# Patient Record
Sex: Male | Born: 1965 | Hispanic: No | Marital: Single | Smoking: Current every day smoker
Health system: Southern US, Community
[De-identification: ages and names within clinical notes are randomized; demographics above are authoritative.]

## PROBLEM LIST (undated history)

## (undated) DIAGNOSIS — M549 Dorsalgia, unspecified: Secondary | ICD-10-CM

## (undated) DIAGNOSIS — F329 Major depressive disorder, single episode, unspecified: Secondary | ICD-10-CM

## (undated) DIAGNOSIS — M62830 Muscle spasm of back: Secondary | ICD-10-CM

## (undated) DIAGNOSIS — F32A Depression, unspecified: Secondary | ICD-10-CM

## (undated) DIAGNOSIS — I10 Essential (primary) hypertension: Secondary | ICD-10-CM

## (undated) DIAGNOSIS — R531 Weakness: Secondary | ICD-10-CM

## (undated) DIAGNOSIS — G8929 Other chronic pain: Secondary | ICD-10-CM

## (undated) HISTORY — PX: OTHER SURGICAL HISTORY: SHX169

## (undated) HISTORY — PX: BACK SURGERY: SHX140

---

## 2005-02-15 ENCOUNTER — Emergency Department (HOSPITAL_COMMUNITY): Admission: EM | Admit: 2005-02-15 | Discharge: 2005-02-15 | Payer: Self-pay | Admitting: Emergency Medicine

## 2005-08-12 ENCOUNTER — Emergency Department (HOSPITAL_COMMUNITY): Admission: EM | Admit: 2005-08-12 | Discharge: 2005-08-12 | Payer: Self-pay | Admitting: Emergency Medicine

## 2005-09-28 ENCOUNTER — Emergency Department (HOSPITAL_COMMUNITY): Admission: EM | Admit: 2005-09-28 | Discharge: 2005-09-28 | Payer: Self-pay | Admitting: Emergency Medicine

## 2005-09-29 ENCOUNTER — Emergency Department (HOSPITAL_COMMUNITY): Admission: EM | Admit: 2005-09-29 | Discharge: 2005-09-29 | Payer: Self-pay | Admitting: Emergency Medicine

## 2006-01-21 ENCOUNTER — Emergency Department (HOSPITAL_COMMUNITY): Admission: EM | Admit: 2006-01-21 | Discharge: 2006-01-22 | Payer: Self-pay | Admitting: Emergency Medicine

## 2006-02-04 ENCOUNTER — Emergency Department (HOSPITAL_COMMUNITY): Admission: EM | Admit: 2006-02-04 | Discharge: 2006-02-04 | Payer: Self-pay | Admitting: Emergency Medicine

## 2006-02-25 ENCOUNTER — Emergency Department (HOSPITAL_COMMUNITY): Admission: EM | Admit: 2006-02-25 | Discharge: 2006-02-25 | Payer: Self-pay | Admitting: Emergency Medicine

## 2006-03-07 ENCOUNTER — Emergency Department (HOSPITAL_COMMUNITY): Admission: EM | Admit: 2006-03-07 | Discharge: 2006-03-07 | Payer: Self-pay | Admitting: Emergency Medicine

## 2006-03-11 ENCOUNTER — Emergency Department (HOSPITAL_COMMUNITY): Admission: EM | Admit: 2006-03-11 | Discharge: 2006-03-11 | Payer: Self-pay | Admitting: Emergency Medicine

## 2006-03-19 ENCOUNTER — Emergency Department (HOSPITAL_COMMUNITY): Admission: EM | Admit: 2006-03-19 | Discharge: 2006-03-19 | Payer: Self-pay | Admitting: Emergency Medicine

## 2006-03-29 ENCOUNTER — Emergency Department (HOSPITAL_COMMUNITY): Admission: EM | Admit: 2006-03-29 | Discharge: 2006-03-29 | Payer: Self-pay | Admitting: Emergency Medicine

## 2006-07-04 ENCOUNTER — Encounter: Admission: RE | Admit: 2006-07-04 | Discharge: 2006-07-04 | Payer: Self-pay | Admitting: Occupational Medicine

## 2010-06-15 ENCOUNTER — Observation Stay (HOSPITAL_COMMUNITY): Admission: EM | Admit: 2010-06-15 | Discharge: 2010-06-16 | Payer: Self-pay | Admitting: Emergency Medicine

## 2010-10-10 LAB — CULTURE, BLOOD (ROUTINE X 2)
Culture  Setup Time: 201111180000
Culture: NO GROWTH
Culture: NO GROWTH

## 2010-10-10 LAB — URINALYSIS, ROUTINE W REFLEX MICROSCOPIC
Glucose, UA: NEGATIVE mg/dL
Hgb urine dipstick: NEGATIVE
Ketones, ur: 15 mg/dL — AB
Protein, ur: NEGATIVE mg/dL

## 2010-10-10 LAB — POCT I-STAT, CHEM 8
Calcium, Ion: 1.07 mmol/L — ABNORMAL LOW (ref 1.12–1.32)
Chloride: 107 mEq/L (ref 96–112)
Glucose, Bld: 106 mg/dL — ABNORMAL HIGH (ref 70–99)
HCT: 49 % (ref 39.0–52.0)
TCO2: 23 mmol/L (ref 0–100)

## 2010-10-10 LAB — DIFFERENTIAL
Basophils Relative: 0 % (ref 0–1)
Eosinophils Relative: 0 % (ref 0–5)
Monocytes Absolute: 1.1 10*3/uL — ABNORMAL HIGH (ref 0.1–1.0)
Monocytes Relative: 5 % (ref 3–12)
Neutro Abs: 17.9 10*3/uL — ABNORMAL HIGH (ref 1.7–7.7)

## 2010-10-10 LAB — PROTIME-INR: INR: 1.14 (ref 0.00–1.49)

## 2010-10-10 LAB — ETHANOL: Alcohol, Ethyl (B): <5 mg/dL (ref 0–10)

## 2010-10-10 LAB — CARDIAC PANEL(CRET KIN+CKTOT+MB+TROPI)
CK, MB: 2.3 ng/mL (ref 0.3–4.0)
CK, MB: 2.6 ng/mL (ref 0.3–4.0)

## 2010-10-10 LAB — URINE CULTURE
Colony Count: NO GROWTH
Culture: NO GROWTH

## 2010-10-10 LAB — CBC
HCT: 45 % (ref 39.0–52.0)
Hemoglobin: 15.1 g/dL (ref 13.0–17.0)
Hemoglobin: 16.4 g/dL (ref 13.0–17.0)
MCH: 30.4 pg (ref 26.0–34.0)
MCH: 31 pg (ref 26.0–34.0)
MCHC: 36.4 g/dL — ABNORMAL HIGH (ref 30.0–36.0)
MCV: 85.1 fL (ref 78.0–100.0)
RBC: 4.96 MIL/uL (ref 4.22–5.81)
WBC: 8.1 10*3/uL (ref 4.0–10.5)

## 2010-10-10 LAB — POCT CARDIAC MARKERS: Troponin i, poc: <0.05 ng/mL (ref 0.00–0.09)

## 2010-10-10 LAB — COMPREHENSIVE METABOLIC PANEL
ALT: 15 U/L (ref 0–53)
AST: 26 U/L (ref 0–37)
CO2: 23 mEq/L (ref 19–32)
Calcium: 8.8 mg/dL (ref 8.4–10.5)
Chloride: 110 mEq/L (ref 96–112)
GFR calc Af Amer: >60 mL/min (ref >60)
GFR calc non Af Amer: >60 mL/min (ref >60)
Potassium: 3.7 mEq/L (ref 3.5–5.1)
Sodium: 139 mEq/L (ref 135–145)
Total Bilirubin: 1.1 mg/dL (ref 0.3–1.2)

## 2010-10-10 LAB — RAPID STREP SCREEN (MED CTR MEBANE ONLY): Streptococcus, Group A Screen (Direct): NEGATIVE

## 2010-10-10 LAB — RAPID URINE DRUG SCREEN, HOSP PERFORMED
Barbiturates: NOT DETECTED
Benzodiazepines: NOT DETECTED
Cocaine: POSITIVE — AB

## 2010-10-10 LAB — LEUKOCYTE ALKALINE PHOSPHATASE: Leukocyte Alkaline  Phos Stain: 150 — ABNORMAL HIGH (ref 30–140)

## 2010-11-10 ENCOUNTER — Emergency Department (HOSPITAL_COMMUNITY): Payer: Self-pay

## 2010-11-10 ENCOUNTER — Emergency Department (HOSPITAL_COMMUNITY)
Admission: EM | Admit: 2010-11-10 | Discharge: 2010-11-10 | Disposition: A | Discharge status: Home / Self Care | Payer: Self-pay | Attending: Emergency Medicine | Admitting: Emergency Medicine

## 2010-11-10 DIAGNOSIS — I491 Atrial premature depolarization: Secondary | ICD-10-CM | POA: Insufficient documentation

## 2010-11-10 DIAGNOSIS — R0789 Other chest pain: Secondary | ICD-10-CM | POA: Insufficient documentation

## 2010-11-10 LAB — COMPREHENSIVE METABOLIC PANEL
AST: 26 U/L (ref 0–37)
Albumin: 4 g/dL (ref 3.5–5.2)
Alkaline Phosphatase: 78 U/L (ref 39–117)
Chloride: 105 mEq/L (ref 96–112)
GFR calc Af Amer: >60 mL/min (ref >60)
Potassium: 3.9 mEq/L (ref 3.5–5.1)
Sodium: 136 mEq/L (ref 135–145)
Total Bilirubin: 1 mg/dL (ref 0.3–1.2)
Total Protein: 7.2 g/dL (ref 6.0–8.3)

## 2010-11-10 LAB — POCT CARDIAC MARKERS: Troponin i, poc: <0.05 ng/mL (ref 0.00–0.09)

## 2010-11-10 LAB — CBC
HCT: 43.2 % (ref 39.0–52.0)
Hemoglobin: 16 g/dL (ref 13.0–17.0)
MCH: 31.3 pg (ref 26.0–34.0)
MCV: 84.4 fL (ref 78.0–100.0)
RBC: 5.12 MIL/uL (ref 4.22–5.81)

## 2011-08-14 ENCOUNTER — Emergency Department (INDEPENDENT_AMBULATORY_CARE_PROVIDER_SITE_OTHER)
Admission: EM | Admit: 2011-08-14 | Discharge: 2011-08-14 | Disposition: A | Discharge status: Home / Self Care | Payer: BC Managed Care – PPO | Source: Home / Self Care | Attending: Emergency Medicine | Admitting: Emergency Medicine

## 2011-08-14 ENCOUNTER — Encounter (HOSPITAL_COMMUNITY): Payer: Self-pay

## 2011-08-14 ENCOUNTER — Emergency Department (INDEPENDENT_AMBULATORY_CARE_PROVIDER_SITE_OTHER): Payer: BC Managed Care – PPO

## 2011-08-14 DIAGNOSIS — S20229A Contusion of unspecified back wall of thorax, initial encounter: Secondary | ICD-10-CM

## 2011-08-14 DIAGNOSIS — S300XXA Contusion of lower back and pelvis, initial encounter: Secondary | ICD-10-CM

## 2011-08-14 MED ORDER — TRAMADOL HCL 50 MG PO TABS: 100.0000 mg | ORAL_TABLET | Freq: Three times a day (TID) | ORAL | Status: AC | PRN

## 2011-08-14 MED ORDER — IBUPROFEN 800 MG PO TABS
ORAL_TABLET | ORAL | Status: AC
  Filled 2011-08-14: qty 1

## 2011-08-14 MED ORDER — METHOCARBAMOL 500 MG PO TABS: 500.0000 mg | ORAL_TABLET | Freq: Three times a day (TID) | ORAL | Status: AC

## 2011-08-14 MED ORDER — IBUPROFEN 800 MG PO TABS: 800.0000 mg | ORAL_TABLET | Freq: Once | ORAL | Status: DC

## 2011-08-14 NOTE — ED Provider Notes (Signed)
Chief Complaint  Patient presents with  . Back Pain    History of Present Illness:  5 days ago, the patient slipped going down some steps and fell down 4 steps, landing on his buttocks and back. Ever since then he's had pain in his lower back area and some tingling in the lower back. There's been no radiation. No numbness, tingling, weakness, bladder, or bowel symptoms. He denies any abdominal pain. He did not hit his head and did not lose consciousness. There's been no blood in his urine. It hurts to walk stand and bend or lift.  Review of Systems:  Other than noted above, the patient denies any of the following symptoms: Systemic:  No fever, chills, fatigue, or weight loss. GI:  No abdominal pain, nausea, vomiting, diarrhea, constipation or blood in stool. GU:  No dysuria, frequency, urgency, or hematuria. No incontinence or difficulty urinating.  M-S:  No neck pain, joint pain, arthritis, or myalgias. Neuro:  No parethesias or muscular weakness. Skin:  No rash or itching.   PMFSH:  Past medical history, family history, social history, meds, and allergies were reviewed.  Physical Exam:   Vital signs:  BP 127/86  Pulse 84  Temp(Src) 98.4 F (36.9 C) (Oral)  Resp 18  SpO2 100% General:  Alert, oriented, in no distress. Abdomen:  Soft, non-tender.  No organomegaly or mass.  No pulsatile midline abdominal mass or bruit. Back:  Exam of the back reveals tenderness to palpation over the entire lumbar spine. There is no swelling, bruising, or deformity. The back has limited range of motion, about 30% of normal with pain. Straight leg raising was negative. Neuro:  Normal muscle strength, sensations and DTRs. Skin:  Clear, warm and dry.  No rash.   Radiology:  Dg Lumbar Spine Complete  08/14/2011  *RADIOLOGY REPORT*  Clinical Data: Low back pain after falling down steps several days ago.  LUMBAR SPINE - COMPLETE 4+ VIEW  Comparison: Radiographs dated 02/15/2005  Findings: There is no acute  fracture.  There is slight retrolisthesis of L3 on L4.  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  IMPRESSION: No acute abnormalities.  Chronic changes at L5-S1 as described.  Original Report Authenticated By: Gwynn Burly, M.D.    Medications given in [facility]:  None  Assessment:   Diagnoses that have been ruled out:  None  Diagnoses that are still under consideration:  None  Final diagnoses:  Contusion of lower back    Plan:   1.  The following meds were prescribed:   New Prescriptions   METHOCARBAMOL (ROBAXIN) 500 MG TABLET    Take 1 tablet (500 mg total) by mouth 3 (three) times daily.   TRAMADOL (ULTRAM) 50 MG TABLET    Take 2 tablets (100 mg total) by mouth every 8 (eight) hours as needed for pain.   2.  The patient was instructed in symptomatic care and handouts were given. 3.  The patient was told to return if becoming worse in any way, if no better in 3 or 4 days, and given some red flag symptoms that would indicate earlier return.    Roque Lias, MD 08/14/11 [phone]

## 2011-08-14 NOTE — ED Notes (Signed)
Pt states he was moving a love seat on Friday, fell down approx 4 steps.  C/o low back pain that has progressively gotten worse.  Has applied hot compresses without relief

## 2011-11-20 ENCOUNTER — Emergency Department (INDEPENDENT_AMBULATORY_CARE_PROVIDER_SITE_OTHER)
Admission: EM | Admit: 2011-11-20 | Discharge: 2011-11-20 | Disposition: A | Discharge status: Home / Self Care | Payer: BC Managed Care – PPO | Source: Home / Self Care | Attending: Emergency Medicine | Admitting: Emergency Medicine

## 2011-11-20 ENCOUNTER — Encounter (HOSPITAL_COMMUNITY): Payer: Self-pay | Admitting: *Deleted

## 2011-11-20 DIAGNOSIS — S39012A Strain of muscle, fascia and tendon of lower back, initial encounter: Secondary | ICD-10-CM

## 2011-11-20 DIAGNOSIS — S335XXA Sprain of ligaments of lumbar spine, initial encounter: Secondary | ICD-10-CM

## 2011-11-20 MED ORDER — METHOCARBAMOL 500 MG PO TABS: 500.0000 mg | ORAL_TABLET | Freq: Three times a day (TID) | ORAL | Status: AC

## 2011-11-20 MED ORDER — PREDNISONE 5 MG PO KIT: 1.0000 | PACK | Freq: Every day | ORAL | Status: DC

## 2011-11-20 MED ORDER — TRAMADOL HCL 50 MG PO TABS
100.0000 mg | ORAL_TABLET | Freq: Three times a day (TID) | ORAL | Status: AC | PRN
Start: 2011-11-20 — End: 2011-11-30

## 2011-11-20 NOTE — ED Notes (Signed)
States holding bucket of sand and "turned the wrong way" and low back "twinged" and pt states he "buckled over" but states he did not his the floor. States his low back hurt real bad.

## 2011-11-20 NOTE — ED Provider Notes (Signed)
Chief Complaint  Patient presents with  . Back Pain    History of Present Illness:   The patient is a 46 year old male who injured his low back at work today. He was passing a bucket of sand to someone else, he twisted and felt a twinge in his lower lumbar spine. His back buckled on him but he did not hit the ground. The pain is now localized to the midline, lower back without radiation to the legs. There is no numbness, tingling, weakness in lower extremities. No bladder or bowel symptoms. He does have a history of arthritis in his back.  Review of Systems:  Other than noted above, the patient denies any of the following symptoms: Systemic:  No fever, chills, fatigue, or weight loss. GI:  No abdominal pain, nausea, vomiting, diarrhea, constipation or blood in stool. GU:  No dysuria, frequency, urgency, or hematuria. No incontinence or difficulty urinating.  M-S:  No neck pain, joint pain, arthritis, or myalgias. Neuro:  No parethesias or muscular weakness. Skin:  No rash or itching.   PMFSH:  Past medical history, family history, social history, meds, and allergies were reviewed.  Physical Exam:   Vital signs:  BP 154/91  Pulse 84  Temp(Src) 98.8 F (37.1 C) (Oral)  Resp 16  SpO2 100% General:  Alert, oriented, in no distress. Abdomen:  Soft, non-tender.  No organomegaly or mass.  No pulsatile midline abdominal mass or bruit. Back:  There is pain to palpation in the midline lower lumbar spine. No pain to palpation in the paravertebral muscles, but he does have some muscle spasm. There is no pain over the sacroiliac area. The back has a limited range of motion with 30 of flexion, 20 lateral bending, 10 extension, and 45 of rotation with pain. Straight leg raising was negative. Neuro:  Normal muscle strength, sensations and DTRs. Skin:  Clear, warm and dry.  No rash.  Assessment:  The encounter diagnosis was Lumbar strain.  Plan:   1.  The following meds were prescribed:   New  Prescriptions   METHOCARBAMOL (ROBAXIN) 500 MG TABLET    Take 1 tablet (500 mg total) by mouth 3 (three) times daily.   PREDNISONE 5 MG KIT    Take 1 kit (5 mg total) by mouth daily after breakfast. Prednisone 5 mg 6 day dosepack.  Take as directed.   TRAMADOL (ULTRAM) 50 MG TABLET    Take 2 tablets (100 mg total) by mouth every 8 (eight) hours as needed for pain.   2.  The patient was instructed in symptomatic care and handouts were given. 3.  The patient was told to return if becoming worse in any way, if no better in 3 or 4 days, and given some red flag symptoms that would indicate earlier return. He was told to followup in 2 days at: Occupational health. In the meantime he is given back exercises to start. He will be allowed to go back to work tomorrow but with light duty. No lifting over 10 pounds.    Reuben Likes, MD 11/20/11 2003

## 2011-11-20 NOTE — Discharge Instructions (Signed)
Back Exercises Back exercises help treat and prevent back injuries. The goal of back exercises is to increase the strength of your abdominal and back muscles and the flexibility of your back. These exercises should be started when you no longer have back pain. Back exercises include:  Pelvic Tilt. Lie on your back with your knees bent. Tilt your pelvis until the lower part of your back is against the floor. Hold this position 5 to 10 sec and repeat 5 to 10 times.   Knee to Chest. Pull first 1 knee up against your chest and hold for 20 to 30 seconds, repeat this with the other knee, and then both knees. This may be done with the other leg straight or bent, whichever feels better.   Sit-Ups or Curl-Ups. Bend your knees 90 degrees. Start with tilting your pelvis, and do a partial, slow sit-up, lifting your trunk only 30 to 45 degrees off the floor. Take at least 2 to 3 seconds for each sit-up. Do not do sit-ups with your knees out straight. If partial sit-ups are difficult, simply do the above but with only tightening your abdominal muscles and holding it as directed.   Hip-Lift. Lie on your back with your knees flexed 90 degrees. Push down with your feet and shoulders as you raise your hips a couple inches off the floor; hold for 10 seconds, repeat 5 to 10 times.   Back arches. Lie on your stomach, propping yourself up on bent elbows. Slowly press on your hands, causing an arch in your low back. Repeat 3 to 5 times. Any initial stiffness and discomfort should lessen with repetition over time.   Shoulder-Lifts. Lie face down with arms beside your body. Keep hips and torso pressed to floor as you slowly lift your head and shoulders off the floor.  Do not overdo your exercises, especially in the beginning. Exercises may cause you some mild back discomfort which lasts for a few minutes; however, if the pain is more severe, or lasts for more than 15 minutes, do not continue exercises until you see your  caregiver. Improvement with exercise therapy for back problems is slow.  See your caregivers for assistance with developing a proper back exercise program. Document Released: 08/23/2004 Document Revised: 07/05/2011 Document Reviewed: 07/16/2005 [employer] Patient Information 2012 [employer]. 

## 2012-01-27 ENCOUNTER — Encounter (HOSPITAL_COMMUNITY): Payer: Self-pay | Admitting: Emergency Medicine

## 2012-01-27 ENCOUNTER — Emergency Department (HOSPITAL_COMMUNITY)
Admission: EM | Admit: 2012-01-27 | Discharge: 2012-01-27 | Disposition: A | Discharge status: Home / Self Care | Payer: Self-pay | Attending: Emergency Medicine | Admitting: Emergency Medicine

## 2012-01-27 ENCOUNTER — Emergency Department (HOSPITAL_COMMUNITY): Payer: Self-pay

## 2012-01-27 DIAGNOSIS — R109 Unspecified abdominal pain: Secondary | ICD-10-CM

## 2012-01-27 DIAGNOSIS — R1012 Left upper quadrant pain: Secondary | ICD-10-CM | POA: Insufficient documentation

## 2012-01-27 DIAGNOSIS — F1410 Cocaine abuse, uncomplicated: Secondary | ICD-10-CM | POA: Insufficient documentation

## 2012-01-27 DIAGNOSIS — F17200 Nicotine dependence, unspecified, uncomplicated: Secondary | ICD-10-CM | POA: Insufficient documentation

## 2012-01-27 DIAGNOSIS — R000 Tachycardia, unspecified: Secondary | ICD-10-CM | POA: Insufficient documentation

## 2012-01-27 LAB — POCT I-STAT TROPONIN I

## 2012-01-27 LAB — RAPID URINE DRUG SCREEN, HOSP PERFORMED
Benzodiazepines: NOT DETECTED
Cocaine: POSITIVE — AB

## 2012-01-27 LAB — CBC WITH DIFFERENTIAL/PLATELET
Eosinophils Absolute: 0 10*3/uL (ref 0.0–0.7)
Hemoglobin: 15.8 g/dL (ref 13.0–17.0)
Lymphocytes Relative: 15 % (ref 12–46)
Lymphs Abs: 0.9 10*3/uL (ref 0.7–4.0)
MCH: 30 pg (ref 26.0–34.0)
MCV: 84.4 fL (ref 78.0–100.0)
Monocytes Relative: 7 % (ref 3–12)
Neutrophils Relative %: 77 % (ref 43–77)
RBC: 5.26 MIL/uL (ref 4.22–5.81)
WBC: 5.7 10*3/uL (ref 4.0–10.5)

## 2012-01-27 LAB — COMPREHENSIVE METABOLIC PANEL
ALT: 17 U/L (ref 0–53)
Alkaline Phosphatase: 93 U/L (ref 39–117)
BUN: 12 mg/dL (ref 6–23)
CO2: 22 mEq/L (ref 19–32)
GFR calc Af Amer: 84 mL/min — ABNORMAL LOW (ref >90)
GFR calc non Af Amer: 73 mL/min — ABNORMAL LOW (ref >90)
Glucose, Bld: 112 mg/dL — ABNORMAL HIGH (ref 70–99)
Potassium: 3.8 mEq/L (ref 3.5–5.1)
Sodium: 139 mEq/L (ref 135–145)
Total Bilirubin: 0.5 mg/dL (ref 0.3–1.2)
Total Protein: 7.8 g/dL (ref 6.0–8.3)

## 2012-01-27 LAB — LIPASE, BLOOD: Lipase: 13 U/L (ref 11–59)

## 2012-01-27 MED ORDER — IOHEXOL 300 MG/ML  SOLN
100.0000 mL | Freq: Once | INTRAMUSCULAR | Status: AC | PRN
  Administered 2012-01-27: 100 mL via INTRAVENOUS

## 2012-01-27 MED ORDER — LORAZEPAM 2 MG/ML IJ SOLN
1.0000 mg | Freq: Once | INTRAMUSCULAR | Status: AC
  Administered 2012-01-27: 1 mg via INTRAVENOUS
  Filled 2012-01-27: qty 1

## 2012-01-27 MED ORDER — ONDANSETRON HCL 4 MG/2ML IJ SOLN
4.0000 mg | Freq: Once | INTRAMUSCULAR | Status: AC
  Administered 2012-01-27: 4 mg via INTRAVENOUS
  Filled 2012-01-27: qty 2

## 2012-01-27 MED ORDER — DIPHENHYDRAMINE HCL 50 MG/ML IJ SOLN
25.0000 mg | Freq: Once | INTRAMUSCULAR | Status: AC
  Administered 2012-01-27: 08:00:00 via INTRAVENOUS
  Filled 2012-01-27: qty 1

## 2012-01-27 MED ORDER — HYDROMORPHONE HCL PF 1 MG/ML IJ SOLN
1.0000 mg | Freq: Once | INTRAMUSCULAR | Status: AC
  Administered 2012-01-27: 1 mg via INTRAVENOUS
  Filled 2012-01-27: qty 1

## 2012-01-27 MED ORDER — IOHEXOL 300 MG/ML  SOLN: 20.0000 mL | INTRAMUSCULAR | Status: AC

## 2012-01-27 MED ORDER — LORAZEPAM 2 MG/ML IJ SOLN
INTRAMUSCULAR | Status: AC
  Administered 2012-01-27: 2 mg
  Filled 2012-01-27: qty 1

## 2012-01-27 MED ORDER — SODIUM CHLORIDE 0.9 % IV BOLUS (SEPSIS)
500.0000 mL | Freq: Once | INTRAVENOUS | Status: AC
  Administered 2012-01-27: 08:00:00 via INTRAVENOUS

## 2012-01-27 NOTE — ED Notes (Signed)
Hunt, PA-C and Rancour, MD aware of ? Seizure activity - pt. Stiff, and unable to turn head to rt. Alert, no drooling, or incontinence; is following commands.

## 2012-01-27 NOTE — ED Provider Notes (Signed)
History     CSN: [id number]  Arrival date & time 01/27/12  0707   First MD Initiated Contact with Patient 01/27/12 [phone]      Chief Complaint  Patient presents with  . Abdominal Pain    (Consider location/radiation/quality/duration/timing/severity/associated sxs/prior treatment) HPI  Patient presents to ER by EMS complaining of acute onset LUQ abdominal pain (not CP as reported initially by EMS) stating that around 4am this morning he used cocaine for the first time "in a long time" and shortly after had acute onset LUQ pain. Patient states pain is severe and constant since onset denying aggravating or alleviating factors. Patient took nothing for pain PTA. Patient states he smoke tobacco and has occasional alcohol use and states that he had not used cocaine for a long time until this morning. Denies any known medical problems and takes no meds on regular basis. Denies hx of similar pain.   History reviewed. No pertinent past medical history.  History reviewed. No pertinent past surgical history.  No family history on file.  History  Substance Use Topics  . Smoking status: Current Everyday Smoker  . Smokeless tobacco: Not on file  . Alcohol Use: No      Review of Systems  All other systems reviewed and are negative.    Allergies  Penicillins and Ibuprofen  Home Medications   Current Outpatient Rx  Name Route Sig Dispense Refill  . PREDNISONE 5 MG PO KIT Oral Take 1 kit (5 mg total) by mouth daily after breakfast. Prednisone 5 mg 6 day dosepack.  Take as directed. 1 kit 0    BP 158/101  Pulse 112  Temp 98.2 F (36.8 C) (Oral)  Resp 13  SpO2 98%  Physical Exam  Nursing note and vitals reviewed. Constitutional: He is oriented to person, place, and time. He appears well-developed and well-nourished. No distress.  HENT:  Head: Normocephalic and atraumatic.  Eyes: Conjunctivae are normal.  Neck: Normal range of motion. Neck supple.  Cardiovascular: Regular  rhythm, normal heart sounds and intact distal pulses.  Tachycardia present.  Exam reveals no gallop and no friction rub.   No murmur heard. Pulmonary/Chest: Effort normal and breath sounds normal. No respiratory distress. He has no wheezes. He has no rales. He exhibits no tenderness.  Abdominal: Soft. Bowel sounds are normal. He exhibits no distension and no mass. There is tenderness. There is no rebound and no guarding.       Moderate TTP of LUQ with guarding but no rigidity or peritoneal signs. Remainder of abdomen non tender.   Musculoskeletal: Normal range of motion. He exhibits no edema and no tenderness.  Neurological: He is alert and oriented to person, place, and time.  Skin: Skin is warm and dry. No rash noted. He is not diaphoretic. No erythema.  Psychiatric: He has a normal mood and affect.    ED Course  Procedures (including critical care time)  IV dilaudid and zofran, IV ativan   Date: 01/27/2012  Rate: 116  Rhythm: sinus tachycardia  QRS Axis: normal  Intervals: normal  ST/T Wave abnormalities: normal  Conduction Disutrbances:none  Narrative Interpretation:   Old EKG Reviewed: non provocative, no significant changes compared to Jun 15, 2010  7:58 AM Called to room for re-evaluation and Dr. Manus Gunning at bedside b/c new complaint of neck pain with "neck stuff to the left" with patient stating he turned head to the left and "now it's stuck". Denies HA, dizziness, visual changes, extremity numbness/tingling/weakness  Patient's neck has  relaxed and no longer spasming with patient sleeping in room. VSS  10:49 AM On reexamination, no TTP of LLQ with deep palpation. Patient is resting comfortably without complaint.   Labs Reviewed  COMPREHENSIVE METABOLIC PANEL - Abnormal; Notable for the following:    Glucose, Bld 112 (*)     GFR calc non Af Amer 73 (*)     GFR calc Af Amer 84 (*)     All other components within normal limits  URINE RAPID DRUG SCREEN (HOSP PERFORMED) -  Abnormal; Notable for the following:    Cocaine POSITIVE (*)     All other components within normal limits  CBC WITH DIFFERENTIAL  LIPASE, BLOOD  POCT I-STAT TROPONIN I  POCT I-STAT TROPONIN I   Ct Abdomen Pelvis W Contrast  01/27/2012  *RADIOLOGY REPORT*  Clinical Data: Left upper abdominal pain  CT ABDOMEN AND PELVIS WITH CONTRAST  Technique:  Multidetector CT imaging of the abdomen and pelvis was performed following the standard protocol during bolus administration of intravenous contrast.  Contrast: OMNIPAQUE IOHEXOL 300 MG/ML  SOLN  Comparison: None.  Findings: There is patchy subsegmental atelectasis or scarring posteriorly in the visualized lung bases.  Unremarkable spleen, adrenal glands,   kidneys, pancreas, gallbladder, liver.  Scattered aortoiliac calcified plaque without aneurysm.  Portal vein patent.  Stomach and small bowel decompressed.  Normal appendix.  The colon is nondilated. Urinary bladder is distended.  Mild prostatic enlargement.  Metallic fragments     inferior to the right hip.  Bilateral L5 pars defects without anterolisthesis.  No ascites.  No free air.  No adenopathy localized. No hydronephrosis.  IMPRESSION:  1.  No acute abdominal process. 2.  Ancillary findings as above.  Original Report Authenticated By: Thora Lance III, M.D.     1. Cocaine abuse   2. Abdominal pain       MDM  Abdominal pain is completely resolved. Patient denies chest pain throughout ER stay and he had a non-provocative EKG in 2 negative troponins coupled with the fact that denies chest pain, cardiac vasospasm or ACS highly unlikely. His left upper quadrant abdominal pain has completely resolved. His vital signs are normalized and are stable. Patient is ambulating without difficulty. Spoke at length with patient about illicit drug use in the need for complete cessation especially the possible adverse outcomes her cocaine use. Patient voices understanding is agreeable  plan.        [place], [place] 01/27/12 1121

## 2012-01-27 NOTE — ED Notes (Signed)
Took 100 pills (220mg ) naproxen in the last 5 days for chest and epigastric abd. Pain. Denies blood stools or emesis.

## 2012-01-27 NOTE — ED Notes (Signed)
Per EMS: Pt reports left sided cp after cocaine use - Pt reports similar pain from previous usage.  Rates 8/10

## 2012-01-27 NOTE — Discharge Instructions (Signed)
It is VERY important to avoid all illegal drug use in the future. Stay well hydrated. Establishment with a Primary Care provider is Very important for general health care concerns, minor illness and minor injury. Return to ER for any emergent changing or worsening of symptoms.    Cocaine Abuse PROBLEMS FROM USING COCAINE:   Highly addictive.   Illegal.   Risk of sudden death.   Heart disease.   Irregular heart beat.   High blood pressure.   Damage to nose and lungs.   Severe agitation.   Hallucinations.   Violent behavior.   Paranoia.   Sexual dysfunction.  Most cocaine users deny that they have a problem with addiction. The biggest problem is admitting that you are dependent on cocaine. Those trying to quit using it may experience depression and withdrawal symptoms. Other withdrawal symptoms include fatigue, suicidal thoughts, sleepiness, restlessness, anxiety, and increased craving for cocaine. There are medications available to help prevent depression associated with stopping cocaine. Most users will find a support group or treatment program helpful in coming off and staying off cocaine. The best chance to cure cocaine addiction is to go into group therapy and to be in a drug-free environment. It is very important to develop healthy relationships and avoid socializing with people who use or deal drugs. Eat well, and give your body the proper rest and healthy exercise it needs. You may need medication to help treat withdrawal symptoms. Call your caregiver or a drug treatment center for more help.  You may also want to call the [facility] at 800-662-HELP in the [place] SEEK IMMEDIATE MEDICAL CARE IF:  You develop severe chest pain.   You develop shortness of breath.   You develop extreme agitation.  Document Released: 08/23/2004 Document Revised: 07/05/2011 Document Reviewed: 05/18/2009 [facility] Patient Information 2012 [place], [place].

## 2012-01-27 NOTE — ED Notes (Signed)
Patient PO contrast taken over, but patient having acute neck pain, and being re-evaluated by MD and RN- Ed, RN aware contrast in room for patient

## 2012-01-27 NOTE — ED Provider Notes (Signed)
Medical screening examination/treatment/procedure(s) were conducted as a shared visit with non-physician practitioner(s) and myself.  I personally evaluated the patient during the encounter  LUQ pain after using cocaine. Tachycardic and uncomfortable.  Glynn Octave, MD 01/27/12 [phone]

## 2012-09-05 ENCOUNTER — Emergency Department (HOSPITAL_COMMUNITY)
Admission: EM | Admit: 2012-09-05 | Discharge: 2012-09-05 | Disposition: A | Discharge status: Home / Self Care | Payer: Worker's Compensation | Attending: Emergency Medicine | Admitting: Emergency Medicine

## 2012-09-05 ENCOUNTER — Encounter (HOSPITAL_COMMUNITY): Payer: Self-pay | Admitting: Emergency Medicine

## 2012-09-05 ENCOUNTER — Emergency Department (HOSPITAL_COMMUNITY): Payer: Worker's Compensation

## 2012-09-05 DIAGNOSIS — F17200 Nicotine dependence, unspecified, uncomplicated: Secondary | ICD-10-CM | POA: Insufficient documentation

## 2012-09-05 DIAGNOSIS — Y9289 Other specified places as the place of occurrence of the external cause: Secondary | ICD-10-CM | POA: Insufficient documentation

## 2012-09-05 DIAGNOSIS — Y9389 Activity, other specified: Secondary | ICD-10-CM | POA: Insufficient documentation

## 2012-09-05 DIAGNOSIS — IMO0002 Reserved for concepts with insufficient information to code with codable children: Secondary | ICD-10-CM | POA: Insufficient documentation

## 2012-09-05 DIAGNOSIS — Y990 Civilian activity done for income or pay: Secondary | ICD-10-CM | POA: Insufficient documentation

## 2012-09-05 DIAGNOSIS — M549 Dorsalgia, unspecified: Secondary | ICD-10-CM

## 2012-09-05 DIAGNOSIS — X503XXA Overexertion from repetitive movements, initial encounter: Secondary | ICD-10-CM | POA: Insufficient documentation

## 2012-09-05 MED ORDER — OXYCODONE-ACETAMINOPHEN 5-325 MG PO TABS
2.0000 | ORAL_TABLET | Freq: Once | ORAL | Status: AC
  Administered 2012-09-05: 2 via ORAL
  Filled 2012-09-05: qty 2

## 2012-09-05 MED ORDER — NAPROXEN 500 MG PO TABS: 500.0000 mg | ORAL_TABLET | Freq: Two times a day (BID) | ORAL | Status: DC

## 2012-09-05 MED ORDER — HYDROCODONE-ACETAMINOPHEN 5-325 MG PO TABS: 1.0000 | ORAL_TABLET | Freq: Four times a day (QID) | ORAL | Status: DC | PRN

## 2012-09-05 MED ORDER — CYCLOBENZAPRINE HCL 10 MG PO TABS: 10.0000 mg | ORAL_TABLET | Freq: Two times a day (BID) | ORAL | Status: DC | PRN

## 2012-09-05 NOTE — ED Notes (Signed)
Lab notified of need for workers comp drug screen 

## 2012-09-05 NOTE — ED Provider Notes (Signed)
History   This chart was scribed for non-physician practitioner working with Gerhard Munch, MD by Frederik Pear, ED Scribe. This patient was seen in room TR05C/TR05C and the patient's care was started at 2024.   CSN: [id number]  Arrival date & time 09/05/12  1913   First MD Initiated Contact with Patient 09/05/12 2024      Chief Complaint  Patient presents with  . Back Pain    (Consider location/radiation/quality/duration/timing/severity/associated sxs/prior treatment) Patient is a 47 y.o. male presenting with back pain. The history is provided by the patient. No language interpreter was used.  Back Pain  This is a new problem. The current episode started 1 to 2 hours ago. The problem occurs constantly. The problem has not changed since onset.Associated with: pushing heavy objects. The pain is present in the lumbar spine. The pain does not radiate. Pertinent negatives include no dysuria. He has tried nothing for the symptoms.    Dara Camargo is a 47 y.o. male who presents to the Emergency Department complaining of sudden onset, constant lower back pain that began at 1800 while he was pushing a 3500 lb bag of gravel on rollers. He states that while he was pushing, he heard a popping noise in his back. He denies any emesis, dysuria, urinary or bowel incontinence. He has a h/o of a similar back injury that also occurred at work in June 2013 and states that the pain feels similar. He reports that his care was managed by UC at [facility] for the previous injury. He has no chronic medical conditions that require daily medications. He states that he is allergic to Penicillin.    History reviewed. No pertinent past medical history.  History reviewed. No pertinent past surgical history.  No family history on file.  History  Substance Use Topics  . Smoking status: Current Every Day Smoker  . Smokeless tobacco: Not on file  . Alcohol Use: No      Review of Systems  Genitourinary: Negative  for dysuria.  Musculoskeletal: Positive for back pain.  All other systems reviewed and are negative.    Allergies  Penicillins  Home Medications   Current Outpatient Rx  Name  Route  Sig  Dispense  Refill  . ADULT MULTIVITAMIN W/MINERALS CH   Oral   Take 1 tablet by mouth daily.           BP 139/98  Pulse 79  Temp 98.4 F (36.9 C)  Resp 20  SpO2 100%  Physical Exam  Nursing note and vitals reviewed. Constitutional: He is oriented to person, place, and time. He appears well-developed and well-nourished. No distress.  HENT:  Head: Normocephalic and atraumatic.  Eyes: EOM are normal. Pupils are equal, round, and reactive to light.  Neck: Normal range of motion. Neck supple. No tracheal deviation present.  Cardiovascular: Normal rate.   Pulmonary/Chest: Effort normal. No respiratory distress.  Abdominal: Soft. He exhibits no distension.  Musculoskeletal: Normal range of motion. He exhibits tenderness. He exhibits no edema.       Lumbar back: He exhibits tenderness.       Tenderness over the upper part of the lumbar spine.  Neurological: He is alert and oriented to person, place, and time.  Skin: Skin is warm and dry.  Psychiatric: He has a normal mood and affect. His behavior is normal.    ED Course  Procedures (including critical care time)  DIAGNOSTIC STUDIES: Oxygen Saturation is 100% on room air, normal by my interpretation.  COORDINATION OF CARE:  20:27- Discussed planned course of treatment with the patient, including a lumbar spine X-ray, who is agreeable at this time.   Labs Reviewed - No data to display Dg Lumbar Spine Complete  09/05/2012  *RADIOLOGY REPORT*  Clinical Data: Injured with low back pain  LUMBAR SPINE - COMPLETE 4+ VIEW  Comparison: CT abdomen pelvis of 01/27/2012  Findings: There is very minimal curvature of the lumbar spine convex to the left.  There is a minimal 4 mm anterolisthesis of L5 on S1 with bilateral pars defects present.   Intervertebral disc spaces are relatively normal.  No compression deformity is seen.  IMPRESSION:  1.  No acute compression deformity.  Normal disc spaces. 2.  Mild 4 mm anterolisthesis of L5 on S1 with bilateral pars defects at L5.   Original Report Authenticated By: Dwyane Dee, M.D.      No diagnosis found.  Lumbar sacral strain.  Anti-inflammatory, muscle relaxant.  Ortho follow-up.  MDM    I personally performed the services described in this documentation, which was scribed in my presence. The recorded information has been reviewed and is accurate.        Jimmye Norman, NP 09/06/12 1056

## 2012-09-05 NOTE — ED Notes (Signed)
Patient is alert and orientedx4.  Patient was explained discharge instructions and he understood them with no questions.  Jose Schultz is coming to transport the patient home.

## 2012-09-05 NOTE — ED Notes (Signed)
Reports lower back pain since 6pm.  Pt states pain started while pushing a bag of gravel at work.

## 2012-09-05 NOTE — ED Notes (Signed)
Called lab due to urine collection for workerman's comp.

## 2012-09-05 NOTE — ED Notes (Signed)
Patient was able to walk to the restroom with some discomfort.

## 2012-09-05 NOTE — ED Notes (Signed)
Patient said he was pushing a sack of rocks at the job, [employer] and he felt the pop, then numbness and tingling.  He then tried to pick up a bag of "277" and he felt the pain.  He told his supervisor and came to the ED.

## 2012-09-06 NOTE — ED Provider Notes (Signed)
  Medical screening examination/treatment/procedure(s) were performed by non-physician practitioner and as supervising physician I was immediately available for consultation/collaboration.   Owin Vignola, MD 09/06/12 2330 

## 2012-10-05 ENCOUNTER — Emergency Department (HOSPITAL_COMMUNITY)
Admission: EM | Admit: 2012-10-05 | Discharge: 2012-10-06 | Disposition: A | Discharge status: Other Acute Inpatient Hospital | Payer: Self-pay | Attending: Emergency Medicine | Admitting: Emergency Medicine

## 2012-10-05 ENCOUNTER — Encounter (HOSPITAL_COMMUNITY): Payer: Self-pay

## 2012-10-05 DIAGNOSIS — Y939 Activity, unspecified: Secondary | ICD-10-CM | POA: Insufficient documentation

## 2012-10-05 DIAGNOSIS — F3289 Other specified depressive episodes: Secondary | ICD-10-CM | POA: Insufficient documentation

## 2012-10-05 DIAGNOSIS — Z23 Encounter for immunization: Secondary | ICD-10-CM | POA: Insufficient documentation

## 2012-10-05 DIAGNOSIS — Z79899 Other long term (current) drug therapy: Secondary | ICD-10-CM | POA: Insufficient documentation

## 2012-10-05 DIAGNOSIS — F17200 Nicotine dependence, unspecified, uncomplicated: Secondary | ICD-10-CM | POA: Insufficient documentation

## 2012-10-05 DIAGNOSIS — Y929 Unspecified place or not applicable: Secondary | ICD-10-CM | POA: Insufficient documentation

## 2012-10-05 DIAGNOSIS — X58XXXA Exposure to other specified factors, initial encounter: Secondary | ICD-10-CM | POA: Insufficient documentation

## 2012-10-05 DIAGNOSIS — F329 Major depressive disorder, single episode, unspecified: Secondary | ICD-10-CM | POA: Insufficient documentation

## 2012-10-05 DIAGNOSIS — IMO0002 Reserved for concepts with insufficient information to code with codable children: Secondary | ICD-10-CM | POA: Insufficient documentation

## 2012-10-05 DIAGNOSIS — R45851 Suicidal ideations: Secondary | ICD-10-CM | POA: Insufficient documentation

## 2012-10-05 DIAGNOSIS — F1410 Cocaine abuse, uncomplicated: Secondary | ICD-10-CM | POA: Insufficient documentation

## 2012-10-05 HISTORY — DX: Major depressive disorder, single episode, unspecified: F32.9

## 2012-10-05 HISTORY — DX: Depression, unspecified: F32.A

## 2012-10-05 LAB — ACETAMINOPHEN LEVEL: Acetaminophen (Tylenol), Serum: <15 ug/mL (ref 10–30)

## 2012-10-05 LAB — CBC
Hemoglobin: 15.1 g/dL (ref 13.0–17.0)
RBC: 4.95 MIL/uL (ref 4.22–5.81)

## 2012-10-05 LAB — COMPREHENSIVE METABOLIC PANEL
ALT: 21 U/L (ref 0–53)
Alkaline Phosphatase: 90 U/L (ref 39–117)
CO2: 25 mEq/L (ref 19–32)
Chloride: 101 mEq/L (ref 96–112)
GFR calc Af Amer: 82 mL/min — ABNORMAL LOW (ref >90)
GFR calc non Af Amer: 71 mL/min — ABNORMAL LOW (ref >90)
Glucose, Bld: 105 mg/dL — ABNORMAL HIGH (ref 70–99)
Potassium: 3.2 mEq/L — ABNORMAL LOW (ref 3.5–5.1)
Sodium: 137 mEq/L (ref 135–145)
Total Protein: 7.5 g/dL (ref 6.0–8.3)

## 2012-10-05 LAB — RAPID URINE DRUG SCREEN, HOSP PERFORMED
Barbiturates: NOT DETECTED
Cocaine: POSITIVE — AB
Tetrahydrocannabinol: NOT DETECTED

## 2012-10-05 MED ORDER — LORAZEPAM 1 MG PO TABS: 1.0000 mg | ORAL_TABLET | Freq: Three times a day (TID) | ORAL | Status: DC | PRN

## 2012-10-05 MED ORDER — NICOTINE 21 MG/24HR TD PT24
21.0000 mg | MEDICATED_PATCH | Freq: Every day | TRANSDERMAL | Status: DC
  Administered 2012-10-05: 21 mg via TRANSDERMAL
  Filled 2012-10-05: qty 1

## 2012-10-05 MED ORDER — CYCLOBENZAPRINE HCL 10 MG PO TABS
10.0000 mg | ORAL_TABLET | Freq: Two times a day (BID) | ORAL | Status: DC | PRN
  Administered 2012-10-05: 10 mg via ORAL
  Filled 2012-10-05: qty 1

## 2012-10-05 MED ORDER — BUPROPION HCL ER (XL) 150 MG PO TB24
150.0000 mg | ORAL_TABLET | Freq: Every day | ORAL | Status: DC
  Administered 2012-10-05: 150 mg via ORAL
  Filled 2012-10-05 (×2): qty 1

## 2012-10-05 MED ORDER — ADULT MULTIVITAMIN W/MINERALS CH
1.0000 | ORAL_TABLET | Freq: Every day | ORAL | Status: DC
  Administered 2012-10-05: 1 via ORAL
  Filled 2012-10-05: qty 1

## 2012-10-05 MED ORDER — ZOLPIDEM TARTRATE 5 MG PO TABS: 5.0000 mg | ORAL_TABLET | Freq: Every evening | ORAL | Status: DC | PRN

## 2012-10-05 MED ORDER — OXYCODONE-ACETAMINOPHEN 5-325 MG PO TABS
1.0000 | ORAL_TABLET | Freq: Four times a day (QID) | ORAL | Status: DC | PRN
  Administered 2012-10-05 – 2012-10-06 (×2): 1 via ORAL
  Filled 2012-10-05 (×2): qty 1

## 2012-10-05 MED ORDER — NAPROXEN 500 MG PO TABS
500.0000 mg | ORAL_TABLET | Freq: Two times a day (BID) | ORAL | Status: DC
  Administered 2012-10-05: 500 mg via ORAL
  Filled 2012-10-05: qty 1

## 2012-10-05 MED ORDER — TETANUS-DIPHTH-ACELL PERTUSSIS 5-2.5-18.5 LF-MCG/0.5 IM SUSP
0.5000 mL | Freq: Once | INTRAMUSCULAR | Status: AC
  Administered 2012-10-05: 0.5 mL via INTRAMUSCULAR
  Filled 2012-10-05: qty 0.5

## 2012-10-05 MED ORDER — ONDANSETRON HCL 4 MG PO TABS: 4.0000 mg | ORAL_TABLET | Freq: Three times a day (TID) | ORAL | Status: DC | PRN

## 2012-10-05 MED ORDER — POTASSIUM CHLORIDE CRYS ER 20 MEQ PO TBCR
40.0000 meq | EXTENDED_RELEASE_TABLET | Freq: Once | ORAL | Status: AC
  Administered 2012-10-05: 40 meq via ORAL
  Filled 2012-10-05: qty 2

## 2012-10-05 MED ORDER — MIRTAZAPINE 15 MG PO TBDP
15.0000 mg | ORAL_TABLET | Freq: Every day | ORAL | Status: DC
  Administered 2012-10-05: 15 mg via ORAL
  Filled 2012-10-05 (×2): qty 1

## 2012-10-05 NOTE — [facility] Counselor (Signed)
Patient has been accepted at [facility] by Verne Spurr PA to the services of Dr. Dub Mikes. Patient to arrive 10/06/2012 after 0830.

## 2012-10-05 NOTE — ED Notes (Signed)
[id number]<AV> Expected date:10/05/12<BR> Expected time: 8:08 AM<BR> Means of arrival:Ambulance<BR> Comments:<BR> Med Clearance

## 2012-10-05 NOTE — ED Provider Notes (Signed)
Psychiatry consult complete. Patient admission recommended. Remeron and Wellbutrin ordered per recommendations.  Glynn Octave, MD 10/05/12 1719

## 2012-10-05 NOTE — ED Notes (Signed)
MD at bedside. 

## 2012-10-05 NOTE — ED Provider Notes (Signed)
History     CSN: [id number]  Arrival date & time 10/05/12  4098   First MD Initiated Contact with Patient 10/05/12 [phone]      Chief Complaint  Patient presents with  . Suicide Attempt    (Consider location/radiation/quality/duration/timing/severity/associated sxs/prior treatment) The history is provided by the patient.  reports depression and SI. Superficial abrasions made to left wrist today in attempt to kill himself. Reports 4 days cocaine binge. No ETOH or other drugs. Denies intentional drug ingestion today. Symptoms are moderate in severity. Reports prior psych hospitalization but does not remember why. Current smoker.   History reviewed. No pertinent past medical history.  History reviewed. No pertinent past surgical history.  No family history on file.  History  Substance Use Topics  . Smoking status: Current Every Day Smoker  . Smokeless tobacco: Not on file  . Alcohol Use: No      Review of Systems  All other systems reviewed and are negative.    Allergies  Penicillins  Home Medications   Current Outpatient Rx  Name  Route  Sig  Dispense  Refill  . cyclobenzaprine (FLEXERIL) 10 MG tablet   Oral   Take 1 tablet (10 mg total) by mouth 2 (two) times daily as needed for muscle spasms.   20 tablet   0   . Multiple Vitamin (MULTIVITAMIN WITH MINERALS) TABS   Oral   Take 1 tablet by mouth daily.         . naproxen (NAPROSYN) 500 MG tablet   Oral   Take 1 tablet (500 mg total) by mouth 2 (two) times daily with a meal.   20 tablet   0   . oxyCODONE-acetaminophen (PERCOCET) 7.5-325 MG per tablet   Oral   Take 1 tablet by mouth every 4 (four) hours as needed for pain (pain).           BP 142/98  Pulse 99  Temp(Src) 98.9 F (37.2 C) (Oral)  Resp 20  SpO2 97%  Physical Exam  Nursing note and vitals reviewed. Constitutional: He is oriented to person, place, and time. He appears well-developed and well-nourished.  HENT:  Head: Normocephalic  and atraumatic.  Eyes: EOM are normal.  Neck: Normal range of motion.  Cardiovascular: Normal rate, regular rhythm, normal heart sounds and intact distal pulses.   Pulmonary/Chest: Effort normal and breath sounds normal. No respiratory distress.  Abdominal: Soft. He exhibits no distension. There is no tenderness.  Musculoskeletal: Normal range of motion.  Superficial abrasions of left anterior forearm without active bleeding. Nothing that will need lac repair  Neurological: He is alert and oriented to person, place, and time.  Skin: Skin is warm and dry.  Psychiatric: Judgment normal. His speech is delayed. He is slowed and withdrawn. Cognition and memory are normal. He exhibits a depressed mood. He expresses suicidal ideation.    ED Course  Procedures (including critical care time)  Labs Reviewed  ACETAMINOPHEN LEVEL  CBC  COMPREHENSIVE METABOLIC PANEL  ETHANOL  SALICYLATE LEVEL  URINE RAPID DRUG SCREEN (HOSP PERFORMED)   No results found.   No diagnosis found.    MDM  Telepsych. ACT. Tetanus. Sitter. Will follow up on labs        Lyanne Co, MD 10/05/12 [phone]

## 2012-10-05 NOTE — ED Notes (Signed)
telepsych info faxed and called 

## 2012-10-05 NOTE — ED Notes (Signed)
Patient states that he has done this( cocaine binge) before. Upset that he 'left his daughter'. States that he has never been away from her and has not seen her in 3 days. Does not know why he started binge this time. Patient very cooperative and states that he needs help.
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
## 2012-10-05 NOTE — ED Notes (Signed)
Per EMS patient walking down [address] called 911 from help. Crack Cocaine  binge for last 4 days. Suicide attempt by cutting superficially left wrist.

## 2012-10-06 ENCOUNTER — Inpatient Hospital Stay (HOSPITAL_COMMUNITY)
Admission: AD | Admit: 2012-10-06 | Discharge: 2012-10-08 | DRG: 885 | Disposition: A | Discharge status: Home / Self Care | Payer: No Typology Code available for payment source | Source: Intra-hospital | Attending: Psychiatry | Admitting: Psychiatry

## 2012-10-06 ENCOUNTER — Inpatient Hospital Stay (HOSPITAL_COMMUNITY)
Admission: AD | Admit: 2012-10-06 | Discharge: 2012-10-06 | Disposition: A | Discharge status: Home / Self Care | Payer: No Typology Code available for payment source | Source: Intra-hospital | Attending: Internal Medicine | Admitting: Internal Medicine

## 2012-10-06 DIAGNOSIS — F1910 Other psychoactive substance abuse, uncomplicated: Secondary | ICD-10-CM

## 2012-10-06 DIAGNOSIS — F1410 Cocaine abuse, uncomplicated: Secondary | ICD-10-CM | POA: Diagnosis present

## 2012-10-06 DIAGNOSIS — M549 Dorsalgia, unspecified: Secondary | ICD-10-CM | POA: Diagnosis present

## 2012-10-06 DIAGNOSIS — F329 Major depressive disorder, single episode, unspecified: Principal | ICD-10-CM | POA: Diagnosis present

## 2012-10-06 DIAGNOSIS — F1994 Other psychoactive substance use, unspecified with psychoactive substance-induced mood disorder: Secondary | ICD-10-CM

## 2012-10-06 DIAGNOSIS — R45851 Suicidal ideations: Secondary | ICD-10-CM

## 2012-10-06 DIAGNOSIS — Z79899 Other long term (current) drug therapy: Secondary | ICD-10-CM

## 2012-10-06 MED ORDER — ACETAMINOPHEN 325 MG PO TABS: 650.0000 mg | ORAL_TABLET | Freq: Four times a day (QID) | ORAL | Status: DC | PRN

## 2012-10-06 MED ORDER — CYCLOBENZAPRINE HCL 10 MG PO TABS: 10.0000 mg | ORAL_TABLET | Freq: Two times a day (BID) | ORAL | Status: DC | PRN

## 2012-10-06 MED ORDER — MAGNESIUM HYDROXIDE 400 MG/5ML PO SUSP: 30.0000 mL | Freq: Every day | ORAL | Status: DC | PRN

## 2012-10-06 MED ORDER — ALUM & MAG HYDROXIDE-SIMETH 200-200-20 MG/5ML PO SUSP: 30.0000 mL | ORAL | Status: DC | PRN

## 2012-10-06 MED ORDER — OXYCODONE-ACETAMINOPHEN 5-325 MG PO TABS: 1.0000 | ORAL_TABLET | Freq: Four times a day (QID) | ORAL | Status: DC | PRN

## 2012-10-06 MED ORDER — OXYCODONE-ACETAMINOPHEN 5-325 MG PO TABS
1.0000 | ORAL_TABLET | ORAL | Status: DC | PRN
  Administered 2012-10-06 – 2012-10-08 (×5): 1 via ORAL
  Filled 2012-10-06 (×5): qty 1

## 2012-10-06 MED ORDER — ADULT MULTIVITAMIN W/MINERALS CH
1.0000 | ORAL_TABLET | Freq: Every day | ORAL | Status: DC
  Administered 2012-10-06 – 2012-10-08 (×3): 1 via ORAL
  Filled 2012-10-06 (×4): qty 1

## 2012-10-06 MED ORDER — QUETIAPINE FUMARATE 50 MG PO TABS
50.0000 mg | ORAL_TABLET | Freq: Two times a day (BID) | ORAL | Status: DC
  Administered 2012-10-06 – 2012-10-08 (×4): 50 mg via ORAL
  Filled 2012-10-06 (×7): qty 1

## 2012-10-06 MED ORDER — TRAZODONE HCL 50 MG PO TABS
50.0000 mg | ORAL_TABLET | Freq: Every evening | ORAL | Status: DC | PRN
  Administered 2012-10-06 – 2012-10-07 (×2): 50 mg via ORAL
  Filled 2012-10-06 (×2): qty 1

## 2012-10-06 MED ORDER — ALUM & MAG HYDROXIDE-SIMETH 200-200-20 MG/5ML PO SUSP
30.0000 mL | ORAL | Status: DC | PRN
  Administered 2012-10-08: 30 mL via ORAL

## 2012-10-06 MED ORDER — NAPROXEN 500 MG PO TABS
500.0000 mg | ORAL_TABLET | Freq: Two times a day (BID) | ORAL | Status: DC
  Administered 2012-10-06 – 2012-10-08 (×4): 500 mg via ORAL
  Filled 2012-10-06 (×7): qty 1

## 2012-10-06 NOTE — Progress Notes (Signed)
Patient ID: Jose Schultz, male   DOB: Oct 22, 1965, 47 y.o.   MRN: [id number] D: pt. Lying in bed, speaks to writer, but no eye contact, noted irritability.  Pt. Reports depression "6" of 10. Pt. Denies pain. A: Writer introduced self to client, instructed on when night meds to be administered, also encouraged to express concerns.  Staff will monitor q25min for safety. Staff encouraged group. R: Pt. Is safe on the unit, pt. Did not attend group. No concerns verbalized at this time, pt. Only wants to know if he can have a snack. Pt. Informed that snacks will be available after group.

## 2012-10-06 NOTE — BHH Counselor (Signed)
Patient has been accepted at [facility] by Verne Spurr PA to the services of Dr. Dub Mikes. Patient to arrive 10/06/2012 after 0830. EDP-Dr. Blinda Leatherwood notified of patients discharge. Patients nurse-Sheilaalso made aware. Call report # is [phone]. Support paperwork completed and faxed to [facility].
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
## 2012-10-06 NOTE — ED Provider Notes (Signed)
Patient sleeping at time of evaluation. No reported problems overnight. Patient has been accepted to [facility]. Transport after 8:30am.  Gilda Crease, MD 10/06/12 [phone]

## 2012-10-06 NOTE — ED Notes (Signed)
complaining of pain in his back, 8/10 pain level, medicated
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
## 2012-10-06 NOTE — BHH Suicide Risk Assessment (Signed)
Suicide Risk Assessment  Admission Assessment     Nursing information obtained from:    Demographic factors:    Current Mental Status: alert and oriented to 4. Aggressive initially, admits to SI with plan. Limited insight and judgement.   Loss Factors:   Family issues Historical Factors:   History of drug abuse Risk Reduction Factors:   employed  CLINICAL FACTORS:   Depression:   Anhedonia Hopelessness Impulsivity Severe Alcohol/Substance Abuse/Dependencies  COGNITIVE FEATURES THAT CONTRIBUTE TO RISK:  Closed-mindedness Thought constriction (tunnel vision)    SUICIDE RISK:   Mild:  Suicidal ideation of limited frequency, intensity, duration, and specificity.  There are no identifiable plans, no associated intent, mild dysphoria and related symptoms, good self-control (both objective and subjective assessment), few other risk factors, and identifiable protective factors, including available and accessible social support.  PLAN OF CARE: Adjust medications as needed. Consult hospitalist for back pain. Encourage patient to attend groups.  I certify that inpatient services furnished can reasonably be expected to improve the patient's condition.  RAVI, HIMABINDU 10/06/2012, 3:09 PM

## 2012-10-06 NOTE — Progress Notes (Signed)
Psychoeducational Group Note  Date:  10/06/2012 Time:  2000  Group Topic/Focus:  Wrap-Up Group:   The focus of this group is to help patients review their daily goal of treatment and discuss progress on daily workbooks.  Participation Level: Did Not Attend  Participation Quality:  Not Applicable  Affect:  Not Applicable  Cognitive:  Not Applicable  Insight:  Not Applicable  Engagement in Group: Not Applicable  Additional Comments:  Pt did not attend.  Christ Kick 10/06/2012, 9:45 PM

## 2012-10-06 NOTE — Progress Notes (Signed)
Recreation Therapy Notes  Date: 03.10.2014 Time: 3:00pm Location: [facility]  Group Topic/Focus: Stress Managment   Participation Level:  Did not attend    [employer], LRT/ CTRS  Blanch[place], Denise L 10/06/2012 4:02 PM

## 2012-10-06 NOTE — Progress Notes (Signed)
Patient ID: Jose Schultz, male   DOB: 07/20/1966, 47 y.o.   MRN: [id number] He has been in bed sleeping sience admission  He was woken up and it see if he wanted to go to  Lunch. He did not go. At that time he was polite and cooperative. Lunch was brought back to him but he did not eat.

## 2012-10-06 NOTE — Consult Note (Signed)
[facility] Medical Consultation  Jose Schultz ZOX:[id number] DOB: 07-16-1966 DOA: 10/06/2012 PCP: No primary provider on file.   Requesting physician: Dr. Earlene Plater  Date of consultation: 10/06/2012 Reason for consultation: Low back pain  Impression/Recommendations  Principal Problem:   Back pain - unclear etiology at this time, we'll proceed with obtaining x-ray complete imaging of the lumbar spine - We could try analgesia in the meantime until results are available - encourage physical activity and may apply heat pad tot he are or even ice pack depending on what he finds more beneficial   I will followup again tomorrow. Please contact me if I can be of assistance in the meanwhile. Thank you for this consultation.  Chief Complaint: Lower back pain  HPI:  47 y.o. Male currently in [facility] after experiencing SI and SA. Currently he is insisting on receiving Percocet as he claims he has lower back pain. He describes pain as sharp, nonradiating, persistent and 10 /10 in severity, patient denies any specific alleviating or aggravating factors, associated with some numbness and tingling in lower extremities, no similar events in the past. Patient denies fevers and chills, any specific abdominal or urinary concerns, no specific focal neurologic weakness. Patient explains that he is having difficulty ambulating and moving due to pain. [facility] asked to evaluate the need for narcotic use in treatment of acute back pain.  Review of Systems:  Constitutional: Negative for fever, chills, diaphoresis, activity change, appetite change and fatigue.  HENT: Negative for ear pain, nosebleeds, congestion, facial swelling, rhinorrhea, neck pain, neck stiffness and ear discharge.   Eyes: Negative for pain, discharge, redness, itching and visual disturbance.  Respiratory: Negative for cough, choking, chest tightness, shortness of breath, wheezing and stridor.   Cardiovascular: Negative for chest  pain, palpitations and leg swelling.  Gastrointestinal: Negative for abdominal distention.  Genitourinary: Negative for dysuria, urgency, frequency, hematuria, flank pain, decreased urine volume, difficulty urinating and dyspareunia.  Musculoskeletal: Negative for arthralgias and gait problem.  Neurological: Negative for dizziness, tremors, seizures, syncope, facial asymmetry, speech difficulty, weakness, light-headedness, numbness and headaches.  Hematological: Negative for adenopathy. Does not bruise/bleed easily.  Psychiatric/Behavioral: Negative for hallucinations, behavioral problems, confusion, dysphoric mood, decreased concentration and agitation.   Past Medical History  Diagnosis Date  . Depression    No past surgical history on file. Social History:  reports that he has been smoking.  He does not have any smokeless tobacco history on file. He reports that he uses illicit drugs ("Crack" cocaine). He reports that he does not drink alcohol.  Allergies  Allergen Reactions  . Penicillins Diarrhea  . Bee Venom    No family history of cancers or heart disease.   Prior to Admission medications   Medication Sig Start Date End Date Taking? Authorizing Provider  cyclobenzaprine (FLEXERIL) 10 MG tablet Take 1 tablet (10 mg total) by mouth 2 (two) times daily as needed for muscle spasms. 09/05/12   Jimmye Norman, NP  Multiple Vitamin (MULTIVITAMIN WITH MINERALS) TABS Take 1 tablet by mouth daily.    Historical Provider, MD  naproxen (NAPROSYN) 500 MG tablet Take 1 tablet (500 mg total) by mouth 2 (two) times daily with a meal. 09/05/12   Jimmye Norman, NP  oxyCODONE-acetaminophen (PERCOCET) 7.5-325 MG per tablet Take 1 tablet by mouth every 4 (four) hours as needed for pain (pain).    Historical Provider, MD    Physical Exam  Constitutional: Appears well-developed and well-nourished. No distress.  HENT: Normocephalic. External right  and left ear normal. Oropharynx is clear and moist.   Eyes: Conjunctivae and EOM are normal. PERRLA, no scleral icterus.  Neck: Normal ROM. Neck supple. No JVD. No tracheal deviation. No thyromegaly.  CVS: RRR, S1/S2 +, no murmurs, no gallops, no carotid bruit.  Pulmonary: Effort and breath sounds normal, no stridor, rhonchi, wheezes, rales.  Abdominal: Soft. BS +,  no distension, tenderness, rebound or guarding.  Musculoskeletal: Normal range of motion. No edema. Paraspinal tenderness noted in lumbar area with difficulty on forward flexion and extension  Lymphadenopathy: No lymphadenopathy noted, cervical, inguinal. Neuro: Alert. Normal reflexes, muscle tone coordination. No cranial nerve deficit. Skin: Skin is warm and dry. No rash noted. Not diaphoretic. No erythema. No pallor.  Psychiatric: Normal mood and affect. Behavior, judgment, thought content normal.   Labs on Admission:  Basic Metabolic Panel:  Recent Labs Lab 10/05/12 0900  NA 137  K 3.2*  CL 101  CO2 25  GLUCOSE 105*  BUN 10  CREATININE 1.22  CALCIUM 9.2   Liver Function Tests:  Recent Labs Lab 10/05/12 0900  AST 22  ALT 21  ALKPHOS 90  BILITOT 0.7  PROT 7.5  ALBUMIN 3.8   CBC:  Recent Labs Lab 10/05/12 0900  WBC 7.1  HGB 15.1  HCT 42.5  MCV 85.9  PLT 192   Radiological Exams on Admission: No results found.  EKG: Not indicated   Time spent: Over 30 minutes   Debbora Presto [facility] Pager [phone]  If 7PM-7AM, please contact night-coverage [email or web address] Password [facility] 10/06/2012, 2:59 PM      ;

## 2012-10-07 MED ORDER — LIDOCAINE 5 % EX PTCH
1.0000 | MEDICATED_PATCH | Freq: Every day | CUTANEOUS | Status: DC
  Administered 2012-10-08: 1 via TRANSDERMAL
  Filled 2012-10-07 (×2): qty 1

## 2012-10-07 MED ORDER — POLYETHYLENE GLYCOL 3350 17 G PO PACK
17.0000 g | PACK | Freq: Every day | ORAL | Status: DC
  Administered 2012-10-07: 17 g via ORAL
  Filled 2012-10-07 (×4): qty 1

## 2012-10-07 MED ORDER — DOCUSATE SODIUM 100 MG PO CAPS
100.0000 mg | ORAL_CAPSULE | Freq: Two times a day (BID) | ORAL | Status: DC
  Administered 2012-10-07: 100 mg via ORAL
  Filled 2012-10-07 (×4): qty 1

## 2012-10-07 MED ORDER — LIDOCAINE 5 % EX PTCH
1.0000 | MEDICATED_PATCH | Freq: Once | CUTANEOUS | Status: AC
  Administered 2012-10-07: 1 via TRANSDERMAL
  Filled 2012-10-07 (×2): qty 1

## 2012-10-07 NOTE — Progress Notes (Signed)
[facility] LCSW Group Therapy  Feelings Around Diagnosis  10/07/2012 2:42 PM  Type of Therapy:  Group Therapy  Participation Level:  Minimal  Participation Quality:  Appropriate  Affect:  Depressed  Cognitive:  Appropriate  Insight:  Developing/Improving  Engagement in Therapy:  Developing/Improving  Modes of Intervention:  Discussion, Problem-solving, Rapport Building and Support  Summary of Progress/Problems:  Patient shared his diagnosis does not matter as it is a label given by someone who could not understand him.  Patient shared he is considering returning to [place], [place] at discharge.  He stated he has no support system in [place].  [name] 10/07/2012, 2:42 PM

## 2012-10-07 NOTE — Progress Notes (Addendum)
D:  Patient's self inventory sheet, patient sleeps well, improving appetite, low energy level, poor attention span.  Rated depression and hopelessness #7, anxiety #9.   Denied withdrawals.  SI off/on, contracts for safety.  Has back pain, takes percocet which helps his back pain.   Worst pain 310.  No questions for staff.  No discharge plans.  Needs money to buy medications. A:  Medications administered per MD order.  Support and encouragement given throughout day. R:  Has not been following treatment plan.   Denied HI.   Denied A/V hallucinations.  SI off/on, contracts for safety. Patient would not attend gorups this morning.  Nurse agreed to late snack, explaining to patient that he needs to go to all groups and to dining room for all meals.   Strongly encouraged patient to participated in all activities while at [facility].   Patient continually asking about medications.

## 2012-10-07 NOTE — Progress Notes (Signed)
[facility] PROGRESS NOTE  Lieutenant Abarca ZOX:[id number] DOB: 1965-08-03 DOA: 10/06/2012 PCP: No primary provider on file.  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  Minimal lower lumbar degenerative change.  Would recommend PRN analgesics- Lidocaine patch can be applied, can use heat/ice when patch is NOT on.  Since findings are stable, please have patient follow up with "back dr" - plan is for PT x 2 weeks and decision on surgery per patient.  If concerning symptoms develop please notify us: bowel/bladder incontinence   Constipation: Miralx, colase   Will sign off, please call with questions/concerns     Procedures:  none  Antibiotics:  none  HPI/Subjective:   Objective: Filed Vitals:   10/07/12 0745 10/07/12 0746 10/07/12 1008 10/07/12 1144  BP: 120/84 122/87 120/84 120/84  Pulse: 81 76 81 81  Temp: 97.5 F (36.4 C)     TempSrc: Oral     Resp: 18     Height:      Weight:       No intake or output data in the 24 hours ending 10/07/12 1259 Filed Weights   10/06/12 1030  Weight: 83.915 kg (185 lb)    Exam:   General:  A+Ox3, NAD  Cardiovascular: rrr  Respiratory: clear  Musculoskeletal: mild tenderness to palpation  Data Reviewed: Basic Metabolic Panel:  Recent Labs Lab 10/05/12 0900  NA 137  K 3.2*  CL 101  CO2 25  GLUCOSE 105*  BUN 10  CREATININE 1.22  CALCIUM 9.2   Liver Function Tests:  Recent Labs Lab 10/05/12 0900  AST 22  ALT 21  ALKPHOS 90  BILITOT 0.7  PROT 7.5  ALBUMIN 3.8   No results found for this basename: LIPASE, AMYLASE,  in the last 168 hours No results found for this basename: AMMONIA,  in the last 168 hours CBC:  Recent Labs Lab 10/05/12 0900  WBC 7.1  HGB 15.1  HCT 42.5  MCV 85.9  PLT 192   Cardiac Enzymes: No results found for this basename: CKTOTAL, CKMB, CKMBINDEX, TROPONINI,  in the last 168  hours BNP (last 3 results) No results found for this basename: PROBNP,  in the last 8760 hours CBG: No results found for this basename: GLUCAP,  in the last 168 hours  No results found for this or any previous visit (from the past 240 hour(s)).   Studies: Dg Lumbar Spine Complete  10/06/2012  *RADIOLOGY REPORT*  Clinical Data: Lumbar back pain  LUMBAR SPINE - COMPLETE 4+ VIEW  Comparison: 09/05/2012  Findings: Anterior osteophyte formation along the superior endplate of L4 and L5 and to a lesser extent L2, similar to prior. Minimal intervertebral disc space height loss at L3-4 and L4-5.  There is grade 1 anterolisthesis of L4-L5, with pars defects.  No acute fracture or dislocation.  Overlying soft tissues unremarkable.  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  Minimal lower lumbar degenerative change.   Original Report Authenticated By: Jearld Lesch, M.D.     Scheduled Meds: . lidocaine  1 patch Transdermal Q24H  . multivitamin with minerals  1 tablet Oral Daily  . naproxen  500 mg Oral BID WC  . QUEtiapine  50 mg Oral BID   Continuous Infusions:   Principal Problem:   Back pain    Time spent: 35   Marlin Canary  [facility] Pager [phone] 7PM-7AM, please contact night-coverage at  [email or web address], password [facility] 10/07/2012, 12:59 PM  LOS: 1 day

## 2012-10-07 NOTE — Progress Notes (Signed)
D: PAtient in bed on approach.  Patient laughing and silly on approach.  Patient states he is ready to go home.  Patient states he has learned that it gets better later.  Patient states he needs to leave because he has to pay bills.   Patient denies SI/HI and denies AVH. A: Staff to monitor Q 15 mins for safety.  Encouragement and support offered.  Scheduled medications administered medications. Percocet administered prn for back pain.  Trazodone administered prn for sleep.      R: Patient remains safe on the unit.  Patient attended group tonight.  Patient visible on the unit and interacting with peers.  Patient calm, cooperative and taking administered medications.

## 2012-10-07 NOTE — Progress Notes (Signed)
[name] MD Progress Note  10/07/2012 11:35 AM Jose Schultz  MRN:  [id number] Subjective:  Patient lying in bed this morning. States he continues to feel depressed, not having suicidal thoughts today. Has not attended any groups so far. Tolerating seroquel well.  Diagnosis:   Axis I: Substance Abuse and Substance Induced Mood Disorder Axis II: Deferred Axis III:  Past Medical History  Diagnosis Date  . Depression    Axis IV: other psychosocial or environmental problems Axis V: 41-50 serious symptoms  ADL's:  Intact  Sleep: Good  Appetite:  Fair   Psychiatric Specialty Exam: Review of Systems  Constitutional: Negative.   HENT: Negative.   Eyes: Negative.   Respiratory: Negative.   Cardiovascular: Negative.   Gastrointestinal: Negative.   Genitourinary: Negative.   Musculoskeletal: Positive for back pain.  Skin: Negative.   Neurological: Negative.   Endo/Heme/Allergies: Negative.   Psychiatric/Behavioral: Positive for depression.    Blood pressure 120/84, pulse 81, temperature 97.5 F (36.4 C), temperature source Oral, resp. rate 18, height 6\' 9"  (2.057 m), weight 83.915 kg (185 lb).Body mass index is 19.83 kg/(m^2).  General Appearance: Disheveled  Eye Contact::  Minimal  Speech:  Slow  Volume:  Decreased  Mood:  Depressed and Irritable  Affect:  Constricted and Flat  Thought Process:  Coherent  Orientation:  Full (Time, Place, and Person)  Thought Content:  Rumination  Suicidal Thoughts:  No  Homicidal Thoughts:  No  Memory:  Immediate;   Fair Recent;   Fair Remote;   Fair  Judgement:  Impaired  Insight:  Shallow  Psychomotor Activity:  Decreased  Concentration:  Fair  Recall:  Fair  Akathisia:  No  Handed:  Right  AIMS (if indicated):     Assets:  Vocational/Educational  Sleep:  Number of Hours: 5.5   Current Medications: Current Facility-Administered Medications  Medication Dose Route Frequency Provider Last Rate Last Dose  . acetaminophen (TYLENOL)  tablet 650 mg  650 mg Oral Q6H PRN Verne Spurr, PA-C      . alum & mag hydroxide-simeth (MAALOX/MYLANTA) 200-200-20 MG/5ML suspension 30 mL  30 mL Oral Q4H PRN Verne Spurr, PA-C      . cyclobenzaprine (FLEXERIL) tablet 10 mg  10 mg Oral BID PRN Himabindu Ravi, MD      . magnesium hydroxide (MILK OF MAGNESIA) suspension 30 mL  30 mL Oral Daily PRN Verne Spurr, PA-C      . multivitamin with minerals tablet 1 tablet  1 tablet Oral Daily Himabindu Ravi, MD   1 tablet at 10/07/12 0741  . naproxen (NAPROSYN) tablet 500 mg  500 mg Oral BID WC Himabindu Ravi, MD   500 mg at 10/07/12 0741  . oxyCODONE-acetaminophen (PERCOCET/ROXICET) 5-325 MG per tablet 1 tablet  1 tablet Oral Q4H PRN Dorothea Ogle, MD   1 tablet at 10/07/12 0740  . QUEtiapine (SEROQUEL) tablet 50 mg  50 mg Oral BID Himabindu Ravi, MD   50 mg at 10/07/12 0742  . traZODone (DESYREL) tablet 50 mg  50 mg Oral QHS PRN Verne Spurr, PA-C   50 mg at 10/06/12 2346    Lab Results:  Results for orders placed during the hospital encounter of 10/05/12 (from the past 48 hour(s))  URINE RAPID DRUG SCREEN (HOSP PERFORMED)     Status: Abnormal   Collection Time    10/05/12 12:39 PM      Result Value Range   Opiates NONE DETECTED  NONE DETECTED   Cocaine POSITIVE (*) NONE DETECTED  Benzodiazepines NONE DETECTED  NONE DETECTED   Amphetamines NONE DETECTED  NONE DETECTED   Tetrahydrocannabinol NONE DETECTED  NONE DETECTED   Barbiturates NONE DETECTED  NONE DETECTED   Comment:            DRUG SCREEN FOR MEDICAL PURPOSES     ONLY.  IF CONFIRMATION IS NEEDED     FOR ANY PURPOSE, NOTIFY LAB     WITHIN 5 DAYS.                LOWEST DETECTABLE LIMITS     FOR URINE DRUG SCREEN     Drug Class       Cutoff (ng/mL)     Amphetamine      1000     Barbiturate      200     Benzodiazepine   200     Tricyclics       300     Opiates          300     Cocaine          300     THC              50    Physical Findings: AIMS: Facial and Oral  Movements Muscles of Facial Expression: None, normal Lips and Perioral Area: None, normal Jaw: None, normal Tongue: None, normal,Extremity Movements Upper (arms, wrists, hands, fingers): None, normal Lower (legs, knees, ankles, toes): None, normal, Trunk Movements Neck, shoulders, hips: None, normal, Overall Severity Severity of abnormal movements (highest score from questions above): None, normal Incapacitation due to abnormal movements: None, normal Patient's awareness of abnormal movements (rate only patient's report): No Awareness, Dental Status Current problems with teeth and/or dentures?: No Does patient usually wear dentures?: No  CIWA:  CIWA-Ar Total: 2 COWS:  COWS Total Score: 2  Treatment Plan Summary: Daily contact with patient to assess and evaluate symptoms and progress in treatment Medication management  Plan: Patient does not recall previous medications for his depression. Continue current plan of care. Hospitalist consult done for back pain, lumbar spine x-ray ordered. Physical exam did not reveal any abnormalities except for mild tenderness of spine. Patient continues to insist his pain is 10/10 , yet continues to ambulate freely. Will continue to monitor and plan for discharge once safety is established.  Medical Decision Making Problem Points:  Established problem, stable/improving (1), Review of last therapy session (1) and Review of psycho-social stressors (1) Data Points:  Review of medication regiment & side effects (2) Review of new medications or change in dosage (2)  I certify that inpatient services furnished can reasonably be expected to improve the patient's condition.   RAVI, HIMABINDU 10/07/2012, 11:35 AM

## 2012-10-07 NOTE — Progress Notes (Signed)
[place] LCSW Aftercare Discharge Planning Group Note  10/07/2012 11:36 AM  Participation Quality: Psych   [name] 10/07/2012, 11:36 AM
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
## 2012-10-08 DIAGNOSIS — F329 Major depressive disorder, single episode, unspecified: Principal | ICD-10-CM

## 2012-10-08 MED ORDER — TRAZODONE HCL 50 MG PO TABS: 50.0000 mg | ORAL_TABLET | Freq: Every evening | ORAL | Status: DC | PRN

## 2012-10-08 MED ORDER — QUETIAPINE FUMARATE 50 MG PO TABS: 50.0000 mg | ORAL_TABLET | Freq: Two times a day (BID) | ORAL | Status: DC

## 2012-10-08 MED ORDER — LIDOCAINE 5 % EX PTCH: 1.0000 | MEDICATED_PATCH | Freq: Every day | CUTANEOUS | Status: DC

## 2012-10-08 NOTE — Progress Notes (Signed)
[facility] INPATIENT:  Family/Significant Other Suicide Prevention Education  Suicide Prevention Education:  Education Completed; Lulu Riding, Mother, [phone] ber/significant other) has been identified by the patient as the family member/significant other with whom the patient will be residing, and identified as the person(s) who will aid the patient in the event of a mental health crisis (suicidal ideations/suicide attempt).  With written consent from the patient, the family member/significant other has been provided the following suicide prevention education, prior to the and/or following the discharge of the patient.  The suicide prevention education provided includes the following:  Suicide risk factors  Suicide prevention and interventions  National Suicide Hotline telephone number  [facility] assessment telephone number  [facility] Emergency Assistance 911  [facility] and/or Residential Mobile Crisis Unit telephone number  Request made of family/significant other to:  Remove weapons (e.g., guns, rifles, knives), all items previously/currently identified as safety concern. Patient will not have access to guns in mother's home.   Remove drugs/medications (over-the-counter, prescriptions, illicit drugs), all items previously/currently identified as a safety concern.  The family member/significant other verbalizes understanding of the suicide prevention education information provided.  The family member/significant other agrees to remove the items of safety concern listed above.  [name] 10/08/2012, 11:37 AM

## 2012-10-08 NOTE — [facility] Suicide Risk Assessment (Signed)
Suicide Risk Assessment  Discharge Assessment     Demographic Factors:  Male and Low socioeconomic status, african american  Mental Status Per Nursing Assessment::   On Admission:  Suicidal ideation indicated by patient  Current Mental Status by Physician: Patient alert and oriented to 4. Denies aH/VH/SI/HI.  Loss Factors: Loss of significant relationship  Historical Factors: Impulsivity  Risk Reduction Factors:   Sense of responsibility to family, Employed, Positive social support and Positive coping skills or problem solving skills  Continued Clinical Symptoms:  Depression:   Recent sense of peace/wellbeing  Cognitive Features That Contribute To Risk:  Cognitively intact  Suicide Risk:  Minimal: No identifiable suicidal ideation.  Patients presenting with no risk factors but with morbid ruminations; may be classified as minimal risk based on the severity of the depressive symptoms  Discharge Diagnoses:   AXIS I:  Substance Induced Mood Disorder AXIS II:  Deferred AXIS III:   Past Medical History  Diagnosis Date  . Depression    AXIS IV:  other psychosocial or environmental problems AXIS V:  61-70 mild symptoms  Plan Of Care/Follow-up recommendations:  Activity:  Regular Follow up with outpatient appointments.  Is patient on multiple antipsychotic therapies at discharge:  No   Has Patient had three or more failed trials of antipsychotic monotherapy by history:  No  Recommended Plan for Multiple Antipsychotic Therapies: NA  RAVI, HIMABINDU 10/08/2012, 10:14 AM

## 2012-10-08 NOTE — Tx Team (Signed)
Interdisciplinary Treatment Plan Update   Date Reviewed:  10/08/2012  Time Reviewed:  9:58 AM  Progress in Treatment:   Attending groups: Yes Participating in groups: Yes Taking medication as prescribed: Yes  Tolerating medication: Yes Family/Significant other contact made: Yes, contact made with mother Patient understands diagnosis: Yes  Discussing patient identified problems/goals with staff: Yes Medical problems stabilized or resolved: Yes Denies suicidal/homicidal ideation: Yes Patient has not harmed self or others: Yes  For review of initial/current patient goals, please see plan of care.  Estimated Length of Stay:  Discharge today  Reasons for Continued Hospitalization:   New Problems/Goals identified:    Discharge Plan or Barriers:   Patient relocating to [place]  Additional Comments:  Patient is denying SI/HI and reports being ready to discharge today.  He rates symptoms at zero. Patient shared he plans to return to [place] to be with family.  Patient to arrange follow up upon arrival in [place].  Attendees:  Patient: Jose Schultz 10/08/2012 9:58 AM   Signature: Patrick North, MD 10/08/2012 9:58 AM  Signature: 10/08/2012 9:58 AM  Signature: Harold Barban, RN 10/08/2012 9:58 AM  Signature: 10/08/2012 9:58 AM  Signature:  Fransisca Kaufmann, [facility]  10/08/2012 9:58 AM  Signature:  Juline Patch, LCSW 10/08/2012 9:58 AM  Signature: Silverio Decamp, PMH-NP 10/08/2012 9:58 AM  Signature: Liliane Bade, BSW 10/08/2012 9:58 AM  Signature: Edmonia Caprio, Intern 10/08/2012 10:04 AM  Signature:    Signature:    Signature:      Scribe for Treatment Team:   Juline Patch,  10/08/2012 9:58 AM

## 2012-10-08 NOTE — Progress Notes (Signed)
Adult Psychoeducational Group Note  Date:  10/08/2012 Time:  11:59 AM  Group Topic/Focus:  Coping With Mental Health Crisis:   The purpose of this group is to help patients identify strategies for coping with mental health crisis.  Group discusses possible causes of crisis and ways to manage them effectively.  Participation Level:  Active  Participation Quality:  Appropriate  Affect:  Excited  Cognitive:  Alert  Insight: Limit  Engagement in Group:  Engaged  Modes of Intervention:  Discussion, Education and Support  Additional Comments:  Pt talked about his goal which is to stay of certain situations.  BELL, CLAIRE T 10/08/2012, 11:59 AM

## 2012-10-08 NOTE — Progress Notes (Signed)
Adult Psychoeducational Group Note  Date:  10/08/2012 Time:  10:22 AM  Group Topic/Focus: Therapeutic Activity- Question Newman Pies  Participation Level:  Active  Participation Quality:  Appropriate and Attentive  Affect:  Appropriate  Cognitive:  Alert and Appropriate  Insight: Appropriate  Engagement in Group:  Engaged  Modes of Intervention:  Discussion  Additional Comments:  Pt was appropriate and attentive while attending group. Pt was willing to answer a question from the ball.   Sharyn Lull 10/08/2012, 10:22 AM

## 2012-10-08 NOTE — Progress Notes (Signed)
[facility] LCSW Aftercare Discharge Planning Group Note  10/08/2012 10:04 AM  Participation Quality:  Appropriate and Attentive  Affect:  Appropriate  Cognitive:  Appropriate  Insight:  Engaged  Engagement in Group:  Engaged  Modes of Intervention:  Exploration, Problem-solving, Rapport Building and Support  Summary of Progress/Problems:  Patient reports doing well today and wanting to discharge.  He denies SI/HI and advised he plans to return to [place] upon discharge.  Patient shared he  Spoke with mother and she will buy him a ticket to get home. Daily workbook provided.   [name] 10/08/2012, 10:04 AM

## 2012-10-08 NOTE — Progress Notes (Signed)
Patient ID: Jose Schultz, male   DOB: 24-Nov-1965, 47 y.o.   MRN: [id number] Patient discharged per physician orders; patient received samples and copy of AVS after it was reviewed; patient had no other questions at this time; patient verbalized and signed that he received all his belongings; patient left unit ambulatory

## 2012-10-08 NOTE — Progress Notes (Signed)
Jose Schultz Adult Case Management Discharge Plan :  Will you be returning to the same living situation after discharge: No.  Patient is relocating to [place] to be with family. At discharge, do you have transportation home?:Yes,  Patient [place] Do you have the ability to pay for your medications:No.  Patient assisted with indigent medications.  Release of information consent forms completed and in the chart;  Patient's signature needed at discharge.  Patient to Follow up at: Follow-up Information   Follow up with No follow up scheduled. (Patient relocating to [place] and will arrange his own outpatient services upon getting settled in [place])       Patient denies SI/HI:   Yes,  Patient no longer endorsing SI/HI or thoughts of self harm.    Safety Planning and Suicide Prevention discussed:  Yes,  Reviewed with patient individually.  Jose Schultz 10/08/2012, 3:28 PM
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
## 2012-10-09 NOTE — Progress Notes (Signed)
Clinical Lead contacted patient as AVS and ROI did not match to due aftercare follow up. Patient refused aftercare at this time. This was noted in the AVS with this writer's initials.  No other needs voiced.  Andres Shad, MSW Clinical Lead [phone]

## 2012-10-13 NOTE — Progress Notes (Signed)
Patient Discharge Instructions:  No documentation was sent as the patient refused to sign the release for follow up care.  Jerelene Redden, 10/13/2012, 3:02 PM

## 2013-03-03 ENCOUNTER — Other Ambulatory Visit: Payer: Self-pay | Admitting: Neurosurgery

## 2013-03-16 ENCOUNTER — Inpatient Hospital Stay (HOSPITAL_COMMUNITY): Admission: RE | Admit: 2013-03-16 | Payer: Self-pay | Source: Ambulatory Visit

## 2013-03-23 ENCOUNTER — Encounter (HOSPITAL_COMMUNITY): Admission: RE | Payer: Self-pay | Source: Ambulatory Visit

## 2013-03-23 ENCOUNTER — Inpatient Hospital Stay (HOSPITAL_COMMUNITY): Admission: RE | Admit: 2013-03-23 | Payer: Self-pay | Source: Ambulatory Visit | Admitting: Neurosurgery

## 2013-03-23 SURGERY — POSTERIOR LUMBAR FUSION 1 LEVEL
Anesthesia: General

## 2013-08-20 ENCOUNTER — Other Ambulatory Visit: Payer: Self-pay | Admitting: Neurosurgery

## 2013-08-27 NOTE — Pre-Procedure Instructions (Signed)
Jose Schultz  08/27/2013   Your procedure is scheduled on:  Wed, Feb 4 @ 2:10 PM  Report to [facility] Short Stay Entrance A  at 11:00 AM.  Call this number if you have problems the morning of surgery: [phone]   Remember:   Do not eat food or drink liquids after midnight.   Take these medicines the morning of surgery with A SIP OF WATER: Pain Pill(if needed) and Seroquel(Quetiapine)              No Goody's,BC's,Aleve,Aspirin,Ibuprofen,Fish Oil,or any Herbal Medications   Do not wear jewelry  Do not wear lotions, powders, or colognes. You may wear deodorant.  Men may shave face and neck.  Do not bring valuables to the hospital.  [facility] is not responsible                  for any belongings or valuables.               Contacts, dentures or bridgework may not be worn into surgery.  Leave suitcase in the car. After surgery it may be brought to your room.  For patients admitted to the hospital, discharge time is determined by your                treatment team.               Special Instructions: Shower using CHG 2 nights before surgery and the night before surgery.  If you shower the day of surgery use CHG.  Use special wash - you have one bottle of CHG for all showers.  You should use approximately 1/3 of the bottle for each shower.   Please read over the following fact sheets that you were given: Pain Booklet, Coughing and Deep Breathing, Blood Transfusion Information, MRSA Information and Surgical Site Infection Prevention

## 2013-08-28 ENCOUNTER — Encounter (HOSPITAL_COMMUNITY): Payer: Self-pay

## 2013-08-28 ENCOUNTER — Encounter (HOSPITAL_COMMUNITY)
Admission: RE | Admit: 2013-08-28 | Discharge: 2013-08-28 | Disposition: A | Discharge status: Home / Self Care | Payer: Worker's Compensation | Source: Ambulatory Visit | Attending: Neurosurgery | Admitting: Neurosurgery

## 2013-08-28 DIAGNOSIS — Z01818 Encounter for other preprocedural examination: Secondary | ICD-10-CM | POA: Insufficient documentation

## 2013-08-28 DIAGNOSIS — Z01812 Encounter for preprocedural laboratory examination: Secondary | ICD-10-CM | POA: Insufficient documentation

## 2013-08-28 HISTORY — DX: Other chronic pain: G89.29

## 2013-08-28 HISTORY — DX: Weakness: R53.1

## 2013-08-28 HISTORY — DX: Muscle spasm of back: M62.830

## 2013-08-28 HISTORY — DX: Dorsalgia, unspecified: M54.9

## 2013-08-28 LAB — TYPE AND SCREEN
ABO/RH(D): O NEG
Antibody Screen: NEGATIVE

## 2013-08-28 LAB — SURGICAL PCR SCREEN
MRSA, PCR: NEGATIVE
Staphylococcus aureus: NEGATIVE

## 2013-08-28 LAB — CBC
HEMATOCRIT: 44.7 % (ref 39.0–52.0)
Hemoglobin: 16.4 g/dL (ref 13.0–17.0)
MCH: 30.7 pg (ref 26.0–34.0)
MCHC: 36.7 g/dL — AB (ref 30.0–36.0)
MCV: 83.7 fL (ref 78.0–100.0)
PLATELETS: 141 10*3/uL — AB (ref 150–400)
RBC: 5.34 MIL/uL (ref 4.22–5.81)
RDW: 12.7 % (ref 11.5–15.5)
WBC: 4 10*3/uL (ref 4.0–10.5)

## 2013-08-28 LAB — ABO/RH: ABO/RH(D): O NEG

## 2013-08-28 NOTE — Progress Notes (Signed)
Pt doesn't have a cardiologist  Denies ever having an echo/stress test/heart cath  Pt doesn't have a medical md  Denies EKG or CXR in past yr 

## 2013-09-01 MED ORDER — VANCOMYCIN HCL IN DEXTROSE 1-5 GM/200ML-% IV SOLN
1000.0000 mg | INTRAVENOUS | Status: AC
  Administered 2013-09-02: 1000 mg via INTRAVENOUS
  Filled 2013-09-01: qty 200

## 2013-09-02 ENCOUNTER — Encounter (HOSPITAL_COMMUNITY)
Admission: RE | Disposition: A | Discharge status: Home Health | Payer: Worker's Compensation | Source: Ambulatory Visit | Attending: Neurosurgery

## 2013-09-02 ENCOUNTER — Inpatient Hospital Stay (HOSPITAL_COMMUNITY): Payer: Worker's Compensation

## 2013-09-02 ENCOUNTER — Inpatient Hospital Stay (HOSPITAL_COMMUNITY)
Admission: RE | Admit: 2013-09-02 | Discharge: 2013-09-06 | DRG: 460 | Disposition: A | Discharge status: Home Health | Payer: Worker's Compensation | Source: Ambulatory Visit | Attending: Neurosurgery | Admitting: Neurosurgery

## 2013-09-02 ENCOUNTER — Encounter (HOSPITAL_COMMUNITY): Payer: Worker's Compensation | Admitting: Certified Registered Nurse Anesthetist

## 2013-09-02 ENCOUNTER — Encounter (HOSPITAL_COMMUNITY): Payer: Self-pay | Admitting: *Deleted

## 2013-09-02 ENCOUNTER — Inpatient Hospital Stay (HOSPITAL_COMMUNITY): Payer: Worker's Compensation | Admitting: Certified Registered Nurse Anesthetist

## 2013-09-02 DIAGNOSIS — F17200 Nicotine dependence, unspecified, uncomplicated: Secondary | ICD-10-CM | POA: Diagnosis present

## 2013-09-02 DIAGNOSIS — Y9269 Other specified industrial and construction area as the place of occurrence of the external cause: Secondary | ICD-10-CM

## 2013-09-02 DIAGNOSIS — M51379 Other intervertebral disc degeneration, lumbosacral region without mention of lumbar back pain or lower extremity pain: Principal | ICD-10-CM | POA: Diagnosis present

## 2013-09-02 DIAGNOSIS — M5137 Other intervertebral disc degeneration, lumbosacral region: Principal | ICD-10-CM | POA: Diagnosis present

## 2013-09-02 DIAGNOSIS — M4316 Spondylolisthesis, lumbar region: Secondary | ICD-10-CM | POA: Diagnosis present

## 2013-09-02 DIAGNOSIS — G8929 Other chronic pain: Secondary | ICD-10-CM | POA: Diagnosis present

## 2013-09-02 DIAGNOSIS — M4310 Spondylolisthesis, site unspecified: Secondary | ICD-10-CM | POA: Diagnosis present

## 2013-09-02 SURGERY — POSTERIOR LUMBAR FUSION 1 LEVEL
Anesthesia: General

## 2013-09-02 MED ORDER — THROMBIN 20000 UNITS EX SOLR
CUTANEOUS | Status: DC | PRN
  Administered 2013-09-02: 12:00:00 via TOPICAL

## 2013-09-02 MED ORDER — DIAZEPAM 5 MG PO TABS
5.0000 mg | ORAL_TABLET | Freq: Four times a day (QID) | ORAL | Status: DC | PRN
  Administered 2013-09-03 – 2013-09-06 (×6): 5 mg via ORAL
  Filled 2013-09-02 (×6): qty 1

## 2013-09-02 MED ORDER — ONDANSETRON HCL 4 MG/2ML IJ SOLN
INTRAMUSCULAR | Status: AC
  Filled 2013-09-02: qty 2

## 2013-09-02 MED ORDER — BUPIVACAINE-EPINEPHRINE PF 0.5-1:200000 % IJ SOLN
INTRAMUSCULAR | Status: DC | PRN
  Administered 2013-09-02: 10 mL via PERINEURAL

## 2013-09-02 MED ORDER — PHENYLEPHRINE HCL 10 MG/ML IJ SOLN
INTRAMUSCULAR | Status: DC | PRN
Start: 2013-09-02 — End: 2013-09-02
  Administered 2013-09-02 (×5): 80 ug via INTRAVENOUS

## 2013-09-02 MED ORDER — FENTANYL CITRATE 0.05 MG/ML IJ SOLN
INTRAMUSCULAR | Status: AC
  Filled 2013-09-02: qty 5

## 2013-09-02 MED ORDER — PROPOFOL 10 MG/ML IV BOLUS
INTRAVENOUS | Status: DC | PRN
  Administered 2013-09-02: 200 mg via INTRAVENOUS

## 2013-09-02 MED ORDER — ARTIFICIAL TEARS OP OINT
TOPICAL_OINTMENT | OPHTHALMIC | Status: AC
  Filled 2013-09-02: qty 3.5

## 2013-09-02 MED ORDER — BUPIVACAINE LIPOSOME 1.3 % IJ SUSP
20.0000 mL | INTRAMUSCULAR | Status: DC
  Filled 2013-09-02: qty 20

## 2013-09-02 MED ORDER — GLYCOPYRROLATE 0.2 MG/ML IJ SOLN
INTRAMUSCULAR | Status: AC
  Filled 2013-09-02: qty 4

## 2013-09-02 MED ORDER — ONDANSETRON HCL 4 MG/2ML IJ SOLN
INTRAMUSCULAR | Status: DC | PRN
  Administered 2013-09-02: 4 mg via INTRAVENOUS

## 2013-09-02 MED ORDER — LIDOCAINE HCL (CARDIAC) 20 MG/ML IV SOLN
INTRAVENOUS | Status: AC
  Filled 2013-09-02: qty 10

## 2013-09-02 MED ORDER — PROPOFOL 10 MG/ML IV BOLUS
INTRAVENOUS | Status: AC
  Filled 2013-09-02: qty 20

## 2013-09-02 MED ORDER — MIDAZOLAM HCL 2 MG/2ML IJ SOLN
INTRAMUSCULAR | Status: AC
  Filled 2013-09-02: qty 2

## 2013-09-02 MED ORDER — OXYCODONE-ACETAMINOPHEN 5-325 MG PO TABS
1.0000 | ORAL_TABLET | ORAL | Status: DC | PRN
  Administered 2013-09-03 (×2): 2 via ORAL
  Filled 2013-09-02 (×2): qty 2

## 2013-09-02 MED ORDER — ONDANSETRON HCL 4 MG/2ML IJ SOLN: 4.0000 mg | Freq: Once | INTRAMUSCULAR | Status: DC | PRN

## 2013-09-02 MED ORDER — HYDROMORPHONE HCL PF 1 MG/ML IJ SOLN
0.2500 mg | INTRAMUSCULAR | Status: DC | PRN
  Administered 2013-09-02 (×2): 0.5 mg via INTRAVENOUS

## 2013-09-02 MED ORDER — FENTANYL CITRATE 0.05 MG/ML IJ SOLN
INTRAMUSCULAR | Status: DC | PRN
  Administered 2013-09-02 (×4): 50 ug via INTRAVENOUS
  Administered 2013-09-02: 75 ug via INTRAVENOUS
  Administered 2013-09-02: 175 ug via INTRAVENOUS
  Administered 2013-09-02: 50 ug via INTRAVENOUS

## 2013-09-02 MED ORDER — ONDANSETRON HCL 4 MG/2ML IJ SOLN
4.0000 mg | Freq: Four times a day (QID) | INTRAMUSCULAR | Status: DC | PRN
  Administered 2013-09-03: 4 mg via INTRAVENOUS
  Filled 2013-09-02: qty 2

## 2013-09-02 MED ORDER — DIPHENHYDRAMINE HCL 12.5 MG/5ML PO ELIX: 12.5000 mg | ORAL_SOLUTION | Freq: Four times a day (QID) | ORAL | Status: DC | PRN

## 2013-09-02 MED ORDER — LIDOCAINE HCL 4 % MT SOLN
OROMUCOSAL | Status: DC | PRN
  Administered 2013-09-02: 4 mL via TOPICAL

## 2013-09-02 MED ORDER — BACITRACIN ZINC 500 UNIT/GM EX OINT
TOPICAL_OINTMENT | CUTANEOUS | Status: DC | PRN
  Administered 2013-09-02: 1 via TOPICAL

## 2013-09-02 MED ORDER — HYDROCODONE-ACETAMINOPHEN 5-325 MG PO TABS
1.0000 | ORAL_TABLET | ORAL | Status: DC | PRN
  Filled 2013-09-02: qty 2

## 2013-09-02 MED ORDER — LIDOCAINE HCL (CARDIAC) 20 MG/ML IV SOLN
INTRAVENOUS | Status: DC | PRN
  Administered 2013-09-02: 80 mg via INTRAVENOUS

## 2013-09-02 MED ORDER — ARTIFICIAL TEARS OP OINT
TOPICAL_OINTMENT | OPHTHALMIC | Status: DC | PRN
  Administered 2013-09-02: 1 via OPHTHALMIC

## 2013-09-02 MED ORDER — SODIUM CHLORIDE 0.9 % IJ SOLN: 9.0000 mL | INTRAMUSCULAR | Status: DC | PRN

## 2013-09-02 MED ORDER — PHENOL 1.4 % MT LIQD: 1.0000 | OROMUCOSAL | Status: DC | PRN

## 2013-09-02 MED ORDER — MORPHINE SULFATE (PF) 1 MG/ML IV SOLN
INTRAVENOUS | Status: DC
Start: 2013-09-02 — End: 2013-09-03
  Administered 2013-09-02: 4.5 mg via INTRAVENOUS
  Administered 2013-09-02: 3 mg via INTRAVENOUS
  Administered 2013-09-02: 17:00:00 via INTRAVENOUS
  Administered 2013-09-03: 1.5 mg via INTRAVENOUS
  Administered 2013-09-03: 7.5 mg via INTRAVENOUS
  Administered 2013-09-03: 3 mg via INTRAVENOUS
  Administered 2013-09-03: 10.5 mg via INTRAVENOUS
  Administered 2013-09-03: 6 mg via INTRAVENOUS
  Filled 2013-09-02: qty 25

## 2013-09-02 MED ORDER — VANCOMYCIN HCL 10 G IV SOLR
1250.0000 mg | Freq: Two times a day (BID) | INTRAVENOUS | Status: DC
  Administered 2013-09-02 – 2013-09-03 (×2): 1250 mg via INTRAVENOUS
  Filled 2013-09-02 (×3): qty 1250

## 2013-09-02 MED ORDER — LACTATED RINGERS IV SOLN
INTRAVENOUS | Status: DC
  Administered 2013-09-03: 06:00:00 via INTRAVENOUS

## 2013-09-02 MED ORDER — DOCUSATE SODIUM 100 MG PO CAPS
100.0000 mg | ORAL_CAPSULE | Freq: Two times a day (BID) | ORAL | Status: DC
  Administered 2013-09-02 – 2013-09-06 (×8): 100 mg via ORAL
  Filled 2013-09-02 (×8): qty 1

## 2013-09-02 MED ORDER — ROCURONIUM BROMIDE 50 MG/5ML IV SOLN
INTRAVENOUS | Status: AC
  Filled 2013-09-02: qty 1

## 2013-09-02 MED ORDER — GLYCOPYRROLATE 0.2 MG/ML IJ SOLN
INTRAMUSCULAR | Status: DC | PRN
  Administered 2013-09-02: 7 mg via INTRAVENOUS

## 2013-09-02 MED ORDER — NEOSTIGMINE METHYLSULFATE 1 MG/ML IJ SOLN
INTRAMUSCULAR | Status: AC
  Filled 2013-09-02: qty 10

## 2013-09-02 MED ORDER — ROCURONIUM BROMIDE 100 MG/10ML IV SOLN
INTRAVENOUS | Status: DC | PRN
  Administered 2013-09-02: 50 mg via INTRAVENOUS

## 2013-09-02 MED ORDER — NALOXONE HCL 0.4 MG/ML IJ SOLN: 0.4000 mg | INTRAMUSCULAR | Status: DC | PRN

## 2013-09-02 MED ORDER — ONDANSETRON HCL 4 MG/2ML IJ SOLN: 4.0000 mg | INTRAMUSCULAR | Status: DC | PRN

## 2013-09-02 MED ORDER — BUPIVACAINE LIPOSOME 1.3 % IJ SUSP
INTRAMUSCULAR | Status: DC | PRN
  Administered 2013-09-02: 20 mL

## 2013-09-02 MED ORDER — MORPHINE SULFATE (PF) 1 MG/ML IV SOLN
INTRAVENOUS | Status: AC
  Administered 2013-09-03: 03:00:00
  Filled 2013-09-02: qty 25

## 2013-09-02 MED ORDER — FENTANYL CITRATE 0.05 MG/ML IJ SOLN
INTRAMUSCULAR | Status: AC
  Administered 2013-09-02 (×2): 50 ug
  Filled 2013-09-02: qty 2

## 2013-09-02 MED ORDER — MORPHINE SULFATE 2 MG/ML IJ SOLN: 1.0000 mg | INTRAMUSCULAR | Status: DC | PRN

## 2013-09-02 MED ORDER — NEOSTIGMINE METHYLSULFATE 1 MG/ML IJ SOLN
INTRAMUSCULAR | Status: DC | PRN
  Administered 2013-09-02: 4 mg via INTRAVENOUS

## 2013-09-02 MED ORDER — 0.9 % SODIUM CHLORIDE (POUR BTL) OPTIME
TOPICAL | Status: DC | PRN
  Administered 2013-09-02: 1000 mL

## 2013-09-02 MED ORDER — ALUM & MAG HYDROXIDE-SIMETH 200-200-20 MG/5ML PO SUSP: 30.0000 mL | Freq: Four times a day (QID) | ORAL | Status: DC | PRN

## 2013-09-02 MED ORDER — LACTATED RINGERS IV SOLN
INTRAVENOUS | Status: DC
  Administered 2013-09-02: 08:00:00 via INTRAVENOUS

## 2013-09-02 MED ORDER — MENTHOL 3 MG MT LOZG: 1.0000 | LOZENGE | OROMUCOSAL | Status: DC | PRN

## 2013-09-02 MED ORDER — ACETAMINOPHEN 325 MG PO TABS
650.0000 mg | ORAL_TABLET | ORAL | Status: DC | PRN
Start: 2013-09-02 — End: 2013-09-06
  Administered 2013-09-03 – 2013-09-05 (×9): 650 mg via ORAL
  Filled 2013-09-02 (×9): qty 2

## 2013-09-02 MED ORDER — FENTANYL CITRATE 0.05 MG/ML IJ SOLN: 100.0000 ug | Freq: Once | INTRAMUSCULAR | Status: DC

## 2013-09-02 MED ORDER — PHENYLEPHRINE HCL 10 MG/ML IJ SOLN
10.0000 mg | INTRAVENOUS | Status: DC | PRN
  Administered 2013-09-02: 20 ug/min via INTRAVENOUS

## 2013-09-02 MED ORDER — BACITRACIN 50000 UNITS IM SOLR
INTRAMUSCULAR | Status: DC | PRN
  Administered 2013-09-02: 12:00:00

## 2013-09-02 MED ORDER — HYDROMORPHONE HCL PF 1 MG/ML IJ SOLN
INTRAMUSCULAR | Status: AC
  Filled 2013-09-02: qty 1

## 2013-09-02 MED ORDER — LACTATED RINGERS IV SOLN
INTRAVENOUS | Status: DC | PRN
  Administered 2013-09-02 (×3): via INTRAVENOUS

## 2013-09-02 MED ORDER — ACETAMINOPHEN 650 MG RE SUPP: 650.0000 mg | RECTAL | Status: DC | PRN

## 2013-09-02 MED ORDER — PHENYLEPHRINE 40 MCG/ML (10ML) SYRINGE FOR IV PUSH (FOR BLOOD PRESSURE SUPPORT)
PREFILLED_SYRINGE | INTRAVENOUS | Status: AC
  Filled 2013-09-02: qty 10

## 2013-09-02 MED ORDER — ZOLPIDEM TARTRATE 5 MG PO TABS: 5.0000 mg | ORAL_TABLET | Freq: Every evening | ORAL | Status: DC | PRN

## 2013-09-02 MED ORDER — DIPHENHYDRAMINE HCL 50 MG/ML IJ SOLN: 12.5000 mg | Freq: Four times a day (QID) | INTRAMUSCULAR | Status: DC | PRN

## 2013-09-02 SURGICAL SUPPLY — 78 items
APL SKNCLS STERI-STRIP NONHPOA (GAUZE/BANDAGES/DRESSINGS) ×1
BAG DECANTER FOR FLEXI CONT (MISCELLANEOUS) ×2 IMPLANT
BENZOIN TINCTURE PRP APPL 2/3 (GAUZE/BANDAGES/DRESSINGS) ×2 IMPLANT
BLADE SURG ROTATE 9660 (MISCELLANEOUS) IMPLANT
BRUSH SCRUB EZ PLAIN DRY (MISCELLANEOUS) ×2 IMPLANT
BUR ACORN 6.0 (BURR) ×2 IMPLANT
BUR MATCHSTICK NEURO 3.0 LAGG (BURR) ×2 IMPLANT
CANISTER SUCT 3000ML (MISCELLANEOUS) ×2 IMPLANT
CAP REVERE LOCKING (Cap) ×4 IMPLANT
CONT SPEC 4OZ CLIKSEAL STRL BL (MISCELLANEOUS) ×2 IMPLANT
COVER BACK TABLE 24X17X13 BIG (DRAPES) IMPLANT
COVER TABLE BACK 60X90 (DRAPES) ×2 IMPLANT
DRAPE C-ARM 42X72 X-RAY (DRAPES) ×6 IMPLANT
DRAPE LAPAROTOMY 100X72X124 (DRAPES) ×2 IMPLANT
DRAPE POUCH INSTRU U-SHP 10X18 (DRAPES) ×2 IMPLANT
DRAPE PROXIMA HALF (DRAPES) ×5 IMPLANT
DRAPE SURG 17X23 STRL (DRAPES) ×8 IMPLANT
ELECT BLADE 4.0 EZ CLEAN MEGAD (MISCELLANEOUS) ×2
ELECT REM PT RETURN 9FT ADLT (ELECTROSURGICAL) ×2
ELECTRODE BLDE 4.0 EZ CLN MEGD (MISCELLANEOUS) ×1 IMPLANT
ELECTRODE REM PT RTRN 9FT ADLT (ELECTROSURGICAL) ×1 IMPLANT
EVACUATOR 1/8 PVC DRAIN (DRAIN) ×1 IMPLANT
GAUZE SPONGE 4X4 16PLY XRAY LF (GAUZE/BANDAGES/DRESSINGS) ×2 IMPLANT
GLOVE BIO SURGEON STRL SZ8 (GLOVE) ×1 IMPLANT
GLOVE BIO SURGEON STRL SZ8.5 (GLOVE) ×4 IMPLANT
GLOVE BIOGEL PI IND STRL 7.0 (GLOVE) IMPLANT
GLOVE BIOGEL PI INDICATOR 7.0 (GLOVE) ×2
GLOVE ECLIPSE 7.5 STRL STRAW (GLOVE) ×1 IMPLANT
GLOVE EXAM NITRILE LRG STRL (GLOVE) IMPLANT
GLOVE EXAM NITRILE MD LF STRL (GLOVE) IMPLANT
GLOVE EXAM NITRILE XL STR (GLOVE) IMPLANT
GLOVE EXAM NITRILE XS STR PU (GLOVE) IMPLANT
GLOVE INDICATOR 7.5 STRL GRN (GLOVE) ×1 IMPLANT
GLOVE INDICATOR 8.5 STRL (GLOVE) ×1 IMPLANT
GLOVE SS BIOGEL STRL SZ 8 (GLOVE) ×2 IMPLANT
GLOVE SUPERSENSE BIOGEL SZ 8 (GLOVE) ×2
GLOVE SURG SS PI 7.0 STRL IVOR (GLOVE) ×4 IMPLANT
GOWN BRE IMP SLV AUR LG STRL (GOWN DISPOSABLE) IMPLANT
GOWN BRE IMP SLV AUR XL STRL (GOWN DISPOSABLE) ×2 IMPLANT
GOWN STRL REIN 2XL LVL4 (GOWN DISPOSABLE) IMPLANT
GOWN STRL REUS W/ TWL XL LVL3 (GOWN DISPOSABLE) IMPLANT
GOWN STRL REUS W/TWL XL LVL3 (GOWN DISPOSABLE) ×12
KIT BASIN OR (CUSTOM PROCEDURE TRAY) ×2 IMPLANT
KIT ROOM TURNOVER OR (KITS) ×2 IMPLANT
NDL HYPO 21X1.5 SAFETY (NEEDLE) IMPLANT
NEEDLE HYPO 21X1.5 SAFETY (NEEDLE) IMPLANT
NEEDLE HYPO 22GX1.5 SAFETY (NEEDLE) ×2 IMPLANT
NS IRRIG 1000ML POUR BTL (IV SOLUTION) ×2 IMPLANT
PACK FOAM VITOSS 10CC (Orthopedic Implant) ×1 IMPLANT
PACK LAMINECTOMY NEURO (CUSTOM PROCEDURE TRAY) ×2 IMPLANT
PAD ARMBOARD 7.5X6 YLW CONV (MISCELLANEOUS) ×6 IMPLANT
PATTIES SURGICAL .5 X.5 (GAUZE/BANDAGES/DRESSINGS) ×1 IMPLANT
PATTIES SURGICAL .5 X1 (DISPOSABLE) IMPLANT
PATTIES SURGICAL 1X1 (DISPOSABLE) ×1 IMPLANT
PUTTY 10ML ACTIFUSE ABX (Putty) ×1 IMPLANT
ROD CURVED REVERE 6.35X50MM (Rod) IMPLANT
ROD REVERE 6.35 40MM (Rod) ×2 IMPLANT
SCREW REVERE 5.5X45 (Screw) ×1 IMPLANT
SCREW REVERE 5.5X50 (Screw) ×1 IMPLANT
SCREW REVERE 6.35 5.5X40MM (Screw) IMPLANT
SCREW REVERE 6.35 6.5MMX45 (Screw) IMPLANT
SCREW REVERE 6.35 6.5X40MM (Screw) ×2 IMPLANT
SPACER SUSTAIN O 10X26 12MM (Spacer) ×2 IMPLANT
SPONGE GAUZE 4X4 12PLY (GAUZE/BANDAGES/DRESSINGS) ×2 IMPLANT
SPONGE LAP 4X18 X RAY DECT (DISPOSABLE) IMPLANT
SPONGE NEURO XRAY DETECT 1X3 (DISPOSABLE) IMPLANT
SPONGE SURGIFOAM ABS GEL 100 (HEMOSTASIS) ×2 IMPLANT
STRIP CLOSURE SKIN 1/2X4 (GAUZE/BANDAGES/DRESSINGS) ×2 IMPLANT
SUT VIC AB 1 CT1 18XBRD ANBCTR (SUTURE) ×2 IMPLANT
SUT VIC AB 1 CT1 8-18 (SUTURE) ×4
SUT VIC AB 2-0 CP2 18 (SUTURE) ×4 IMPLANT
SYR 20CC LL (SYRINGE) IMPLANT
SYR 20ML ECCENTRIC (SYRINGE) ×2 IMPLANT
TAPE CLOTH SURG 4X10 WHT LF (GAUZE/BANDAGES/DRESSINGS) ×1 IMPLANT
TOWEL OR 17X24 6PK STRL BLUE (TOWEL DISPOSABLE) ×2 IMPLANT
TOWEL OR 17X26 10 PK STRL BLUE (TOWEL DISPOSABLE) ×2 IMPLANT
TRAY FOLEY CATH 16FRSI W/METER (SET/KITS/TRAYS/PACK) ×1 IMPLANT
WATER STERILE IRR 1000ML POUR (IV SOLUTION) ×2 IMPLANT

## 2013-09-02 NOTE — Progress Notes (Signed)
Patient ID: Jose Schultz, male   DOB: 01-05-1966, 48 y.o.   MRN: [id number] Subjective:  The patient is somnolent but easily arousable. He is in no apparent distress.  Objective: Vital signs in last 24 hours: Temp:  [97 F (36.1 C)-97.6 F (36.4 C)] 97 F (36.1 C) (02/04 1654) Pulse Rate:  [87-107] 107 (02/04 1700) Resp:  [17-20] 17 (02/04 1700) BP: (128-150)/(75-104) 128/75 mmHg (02/04 1700) SpO2:  [98 %-100 %] 98 % (02/04 1700)  Intake/Output from previous day:   Intake/Output this shift: Total I/O In: 2800 [I.V.:2800] Out: 940 [Urine:640; Blood:300]  Physical exam patient is moving his lower strumming is well.  Lab Results: No results found for this basename: WBC, HGB, HCT, PLT,  in the last 72 hours BMET No results found for this basename: NA, K, CL, CO2, GLUCOSE, BUN, CREATININE, CALCIUM,  in the last 72 hours  Studies/Results: Dg Lumbar Spine 2-3 Views  09/02/2013   CLINICAL DATA:  Lumbar spondylolisthesis, radiculopathy, and low back pain. Posterior lumbar interbody fusion.  EXAM: LUMBAR SPINE - 2-3 VIEW; DG C-ARM 61-120 MIN  COMPARISON:  Portable intraoperative radiographs earlier the same day. Lumbar spine radiographs 11/07/2012.  FINDINGS: The vertebral numbering employed on intraoperative radiographs obtained earlier today will be continued, where S1 is considered lumbarized.  Single lateral intraoperative spot fluoroscopic image of the lower lumbar spine is provided. This demonstrates the interval performance of S1-2 posterior lumbar interbody fusion with interbody spacer and pedicle screws. Anterolisthesis of S1 on S2 is unchanged.  IMPRESSION: Intraoperative image during S1-2 PLIF.   Electronically Signed   By: Sebastian AcheAllen  Grady   On: 09/02/2013 16:46   Dg Lumbar Spine 2-3 Views  09/02/2013   CLINICAL DATA:  L5-S1 fusion.  EXAM: LUMBAR SPINE - 2-3 VIEW  COMPARISON:  DG LUMBAR SPINE COMPLETE 4+V dated 11/07/2012; DG LUMBAR SPINE COMPLETE dated 10/06/2012; MR OUTSIDE FILMS SPINE  dated 10/16/2012; DG CHEST 2 VIEW dated 11/10/2010; CT ABD/PELVIS W CM dated 01/27/2012  FINDINGS: In review of prior studies, there is transitional lumbosacral anatomy. There are 12 thoracic type vertebral bodies, small ribs at L1 and a transitional partially lumbarized S1 segment. 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  Two cross-table lateral views of the lumbar spine are submitted. Image number 1 at 1142 hr demonstrates skin spreaders posteriorly with a blunt surgical instrument overlying the posterior elements at the transitional S1 level.  Image number 2 at 1149 hr demonstrates the surgical instrument directed towards the posterior aspect of the upper S2 segment.  IMPRESSION: Intraoperative localization views as described. 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 The report from the outside MRI is not available.   Electronically Signed   By: Roxy HorsemanBill  Veazey M.D.   On: 09/02/2013 15:22   Dg C-arm 61-120 Min  09/02/2013   CLINICAL DATA:  Lumbar spondylolisthesis, radiculopathy, and low back pain. Posterior lumbar interbody fusion.  EXAM: LUMBAR SPINE - 2-3 VIEW; DG C-ARM 61-120 MIN  COMPARISON:  Portable intraoperative radiographs earlier the same day. Lumbar spine radiographs 11/07/2012.  FINDINGS: The vertebral numbering employed on intraoperative radiographs obtained earlier today will be continued, where S1 is considered lumbarized.  Single lateral intraoperative spot fluoroscopic image of the lower lumbar spine is provided. This demonstrates the interval performance of S1-2 posterior lumbar interbody fusion with interbody spacer and pedicle screws. Anterolisthesis of S1 on S2 is unchanged.  IMPRESSION: Intraoperative image during S1-2 PLIF.  Electronically Signed   By: Sebastian Ache   On: 09/02/2013 16:46    Assessment/Plan: The patient is doing well.  LOS: 0 days     Chan Sheahan D 09/02/2013,  5:16 PM

## 2013-09-02 NOTE — Preoperative (Signed)
Beta Blockers   Reason not to administer Beta Blockers:Not Applicable 

## 2013-09-02 NOTE — Transfer of Care (Signed)
Immediate Anesthesia Transfer of Care Note  Patient: Jose Schultz  Procedure(s) Performed: Procedure(s) with comments: LUMBAR FIVE TO SACRAL ONE POSTERIOR LUMBAR FUSION 1 LEVEL (N/A) - L5S1 posterior lumbar fusion with interbody prosthesis and posterior lateral arthrodesis with posterior nonsegmental instrumentation  Patient Location: PACU  Anesthesia Type:General  Level of Consciousness: awake and alert   Airway & Oxygen Therapy: Patient Spontanous Breathing and Patient connected to nasal cannula oxygen  Post-op Assessment: Report given to PACU RN and Post -op Vital signs reviewed and stable  Post vital signs: Reviewed and stable  Complications: No apparent anesthesia complications

## 2013-09-02 NOTE — Progress Notes (Signed)
ANTIBIOTIC CONSULT NOTE - INITIAL  Pharmacy Consult for Vancomycin Indication: surgical prophylaxis  Allergies  Allergen Reactions  . Penicillins Diarrhea  . Bee Venom   . Other     [place]    Patient Measurements: Height: 5' 8.11" (173 cm) Weight: 205 lb 7.5 oz (93.2 kg) IBW/kg (Calculated) : 68.65 Adjusted Body Weight:   Vital Signs: Temp: 98.2 F (36.8 C) (02/04 1848) Temp src: Oral (02/04 1848) BP: 133/78 mmHg (02/04 1848) Pulse Rate: 107 (02/04 1848) Intake/Output from previous day:   Intake/Output from this shift:    Labs: No results found for this basename: WBC, HGB, PLT, LABCREA, CREATININE,  in the last 72 hours Estimated Creatinine Clearance: 84.9 ml/min (by C-G formula based on Cr of 1.22). No results found for this basename: VANCOTROUGH, Leodis BinetVANCOPEAK, VANCORANDOM, GENTTROUGH, GENTPEAK, GENTRANDOM, TOBRATROUGH, TOBRAPEAK, TOBRARND, AMIKACINPEAK, AMIKACINTROU, AMIKACIN,  in the last 72 hours   Microbiology: Recent Results (from the past 720 hour(s))  SURGICAL PCR SCREEN     Status: None   Collection Time    08/28/13  9:12 AM      Result Value Range Status   MRSA, PCR NEGATIVE  NEGATIVE Final   Staphylococcus aureus NEGATIVE  NEGATIVE Final   Comment:            The Xpert SA Assay (FDA     approved for NASAL specimens     in patients over 46 years of age),     is one component of     a comprehensive surveillance     program.  Test performance has     been validated by [facility] for patients greater     than or equal to 46 year old.     It is not intended     to diagnose infection nor to     guide or monitor treatment.    Medical History: Past Medical History  Diagnosis Date  . Muscle spasm of back     takes Flexeril as needed  . Weakness     numbness and tingling both feet  . Chronic back pain     spondylolisthesis and radiculopathy  . Depression     hx of-per pt no meds now    Medications:  Scheduled:  . docusate sodium  100 mg  Oral BID  . HYDROmorphone      . morphine   Intravenous Q4H  . morphine       Assessment: 48 yr old male suffered a back injury at work and has opted for a surgical intervention. He is here for an L5-S1 spinal decompression and fusion. The surgery is now completed and pharmacy is being asked to dose vancomycin for surgical prophylaxis. The pt has a drain.  Goal of Therapy:  Vancomycin trough level 10-15 mcg/ml  Plan:  Vancomycin 1250 mg IV q12 hrs.  Trough levels if the pt stays on Vanc if appropriate.   Eugene Garnetotter, Yaqueline Gutter Sue 09/02/2013,8:20 PM

## 2013-09-02 NOTE — H&P (Signed)
Subjective: The patient is a 48 year old black male who was involved in a work related injury. He failed medical management and was worked up with a lumbar x-rays a lumbar MRI. This demonstrated a L5-S1 spondylolisthesis and L5 pars defects. I discussed the various treatment options with the patient including surgery. The patient has weighed the risks, benefits, and alternatives surgery and decided proceed with at L5-S1 decompression, instrumentation, and fusion.   Past Medical History  Diagnosis Date  . Muscle spasm of back     takes Flexeril as needed  . Weakness     numbness and tingling both feet  . Chronic back pain     spondylolisthesis and radiculopathy  . Depression     hx of-per pt no meds now    Past Surgical History  Procedure Laterality Date  . Right 5th finger surgery      in high school    Allergies  Allergen Reactions  . Penicillins Diarrhea  . Bee Venom   . Other     [place]    History  Substance Use Topics  . Smoking status: Current Every Day Smoker -- 0.50 packs/day for 32 years    Types: Cigarettes  . Smokeless tobacco: Not on file  . Alcohol Use: No    History reviewed. No pertinent family history. Prior to Admission medications   Medication Sig Start Date End Date Taking? Authorizing Provider  cyclobenzaprine (FLEXERIL) 10 MG tablet Take 1 tablet (10 mg total) by mouth 2 (two) times daily as needed for muscle spasms. 09/05/12  Yes Jimmye Norman, NP  HYDROcodone-acetaminophen (NORCO/VICODIN) 5-325 MG per tablet Take 1 tablet by mouth every 6 (six) hours as needed for moderate pain.   Yes Historical Provider, MD  naproxen (NAPROSYN) 375 MG tablet Take 375 mg by mouth 2 (two) times daily with a meal.   Yes Historical Provider, MD     Review of Systems  Positive ROS: As above  All other systems have been reviewed and were otherwise negative with the exception of those mentioned in the HPI and as above.  Objective: Vital signs in last 24  hours: Temp:  [97.6 F (36.4 C)] 97.6 F (36.4 C) (02/04 0827) Pulse Rate:  [87] 87 (02/04 0827) Resp:  [20] 20 (02/04 0827) BP: (150)/(104) 150/104 mmHg (02/04 0827) SpO2:  [100 %] 100 % (02/04 0827)  General Appearance: Alert, cooperative, no distress, Head: Normocephalic, without obvious abnormality, atraumatic Eyes: PERRL, conjunctiva/corneas clear, EOM's intact,    Ears: Normal  Throat: Normal  Neck: Supple, symmetrical, trachea midline, no adenopathy; thyroid: No enlargement/tenderness/nodules; no carotid bruit or JVD Back: Symmetric, no curvature, ROM normal, no CVA tenderness Lungs: Clear to auscultation bilaterally, respirations unlabored Heart: Regular rate and rhythm, no murmur, rub or gallop Abdomen: Soft, non-tender,, no masses, no organomegaly Extremities: Extremities normal, atraumatic, no cyanosis or edema Pulses: 2+ and symmetric all extremities Skin: Skin color, texture, turgor normal, no rashes or lesions  NEUROLOGIC:   Mental status: alert and oriented, no aphasia, good attention span, Fund of knowledge/ memory ok Motor Exam - grossly normal Sensory Exam - grossly normal Reflexes:  Coordination - grossly normal Gait - grossly normal Balance - grossly normal Cranial Nerves: I: smell Not tested  II: visual acuity  OS: Normal  OD: Normal   II: visual fields Full to confrontation  II: pupils Equal, round, reactive to light  III,VII: ptosis None  III,IV,VI: extraocular muscles  Full ROM  V: mastication Normal  V: facial light touch  sensation  Normal  V,VII: corneal reflex  Present  VII: facial muscle function - upper  Normal  VII: facial muscle function - lower Normal  VIII: hearing Not tested  IX: soft palate elevation  Normal  IX,X: gag reflex Present  XI: trapezius strength  5/5  XI: sternocleidomastoid strength 5/5  XI: neck flexion strength  5/5  XII: tongue strength  Normal    Data Review Lab Results  Component Value Date   WBC 4.0  08/28/2013   HGB 16.4 08/28/2013   HCT 44.7 08/28/2013   MCV 83.7 08/28/2013   PLT 141* 08/28/2013   Lab Results  Component Value Date   NA 137 10/05/2012   K 3.2* 10/05/2012   CL 101 10/05/2012   CO2 25 10/05/2012   BUN 10 10/05/2012   CREATININE 1.22 10/05/2012   GLUCOSE 105* 10/05/2012   Lab Results  Component Value Date   INR 1.14 06/15/2010    Assessment/Plan: L5-S1 spondylolisthesis, spondylolysis, lumbago, lumbar radiculopathy, foraminal stenosis: I discussed situation with the patient. I have reviewed his imaging studies with them and pointed out the abnormalities. We have discussed the various treatment options including surgery. I described the surgical treatment option of a L5-S1 decompression, instrumentation, and fusion. I've shown him surgical models. We have discussed the risks, benefits, alternatives, and likelihood of achieving our goals with surgery. I've answered all the patient's questions. He has decided proceed with surgery.   Britne Borelli D 09/02/2013 10:32 AM

## 2013-09-02 NOTE — Anesthesia Procedure Notes (Signed)
Procedure Name: Intubation Date/Time: 09/02/2013 11:01 AM Performed by: Vita BarleyURNER, Amai Cappiello E Pre-anesthesia Checklist: Patient identified, Emergency Drugs available, Suction available and Patient being monitored Patient Re-evaluated:Patient Re-evaluated prior to inductionOxygen Delivery Method: Circle system utilized Preoxygenation: Pre-oxygenation with 100% oxygen Intubation Type: IV induction Ventilation: Mask ventilation without difficulty Laryngoscope Size: Miller and 2 Grade View: Grade I Tube type: Oral Tube size: 7.5 mm Number of attempts: 1 Placement Confirmation: ETT inserted through vocal cords under direct vision,  positive ETCO2 and breath sounds checked- equal and bilateral Secured at: 23 cm Tube secured with: Tape Dental Injury: Teeth and Oropharynx as per pre-operative assessment

## 2013-09-02 NOTE — Anesthesia Preprocedure Evaluation (Addendum)
Anesthesia Evaluation  Patient identified by MRN, date of birth, ID band Patient awake    Reviewed: Allergy & Precautions, H&P , NPO status , Patient's Chart, lab work & pertinent test results  Airway Mallampati: II TM Distance: >3 FB Neck ROM: Full    Dental  (+) Teeth Intact,    Pulmonary Current Smoker,          Cardiovascular     Neuro/Psych    GI/Hepatic   Endo/Other    Renal/GU      Musculoskeletal   Abdominal   Peds  Hematology   Anesthesia Other Findings   Reproductive/Obstetrics                          Anesthesia Physical Anesthesia Plan  ASA: I  Anesthesia Plan: General   Post-op Pain Management:    Induction: Intravenous  Airway Management Planned: Oral ETT  Additional Equipment:   Intra-op Plan:   Post-operative Plan: Extubation in OR  Informed Consent: I have reviewed the patients History and Physical, chart, labs and discussed the procedure including the risks, benefits and alternatives for the proposed anesthesia with the patient or authorized representative who has indicated his/her understanding and acceptance.     Plan Discussed with:   Anesthesia Plan Comments:         Anesthesia Quick Evaluation

## 2013-09-02 NOTE — Op Note (Signed)
Brief history: The patient is a 48 year old black male who was injured at work. He failed medical management and was worked up with a lumbar MRI and lumbar x-rays. This demonstrated patient had disc degeneration and a spondylolisthesis with spondylolysis at L5-S1. I discussed the various treatment options with the patient including surgery. The patient has weighed the risks, benefits, and alternatives surgery and decided proceed with at L5-S1 decompression, instrumentation, and fusion.  Preoperative diagnosis: L5-S1 spondylolisthesis, Degenerative disc disease, spinal stenosis compressing both the L5 and the S1 nerve roots; lumbago; lumbar radiculopathy  Postoperative diagnosis: The same  Procedure: L5 Gill procedure to decompress the bilateral L5 and S1 nerve roots(the work required to do this was in addition to the work required to do the posterior lumbar interbody fusion because of the patient's spinal stenosis, facet arthropathy. Etc. requiring a wide decompression of the nerve roots.); L5-S1 posterior lumbar interbody fusion with local morselized autograft bone and Actifusebone graft extender; insertion of interbody prosthesis at L5-S1 (globus peek interbody prosthesis); posterior nonsegmental instrumentation from L5 to S1 with globus titanium pedicle screws and rods; posterior lateral arthrodesis at L5-S1 with local morselized autograft bone and Vitoss bone graft extender.  Surgeon: Dr. Delma OfficerJeff Zailey Audia  Asst.: Dr. Jillyn HiddenGary cram  Anesthesia: Gen. endotracheal  Estimated blood loss: 300 cc  Drains: One medium Hemovac  Complications: None  Description of procedure: The patient was brought to the operating room by the anesthesia team. General endotracheal anesthesia was induced. The patient was turned to the prone position on the Wilson frame. The patient's lumbosacral region was then prepared with Betadine scrub and Betadine solution. Sterile drapes were applied.  I then injected the area to be  incised with Marcaine with epinephrine solution. I then used the scalpel to make a linear midline incision over the L5-S1 interspace. I then used electrocautery to perform a bilateral subperiosteal dissection exposing the spinous process and lamina of L4-S1. We then obtained intraoperative radiograph to confirm our location. We then inserted the Verstrac retractor to provide exposure. As expected the L5 lamina was "free floating". I incised the interspinous ligament at L4-5 and L5-S1 with a scalpel. I removed the spinous process and part of the L5 lamina with Leksell rongeur. We saved this bone and later use it for autograft bone.  I began the decompression by using the high speed drill to perform laminotomies at L5. We then used the Kerrison punches to complete the L5 laminectomy/Gill procedure and removed the ligamentum flavum at L4-5 and L5-S1. We used the Kerrison punches to remove the medial facets at L5-S1. We performed wide foraminotomies about the bilateral L5 and S1 nerve roots completing the decompression.  We now turned our attention to the posterior lumbar interbody fusion. I used a scalpel to incise the intervertebral disc at L5-S1. I then performed a partial intervertebral discectomy at L5-S1 using the pituitary forceps. We prepared the vertebral endplates at L5-S1 for the fusion by removing the soft tissues with the curettes. We then used the trial spacers to pick the appropriate sized interbody prosthesis. We prefilled his prosthesis with a combination of local morselized autograft bone that we obtained during the decompression as well as Actifuse bone graft extender. We inserted the prefilled prosthesis into the interspace at L5-S1. There was a good snug fit of the prosthesis in the interspace. We then filled and the remainder of the intervertebral disc space with local morselized autograft bone and Actifuse. This completed the posterior lumbar interbody arthrodesis.  We now turned attention  to the instrumentation. Under fluoroscopic guidance we cannulated the bilateral L5 and S1 pedicles with the bone probe. We then removed the bone probe. We then tapped the pedicle with a 5.5 millimeter tap. We then removed the tap. We probed inside the tapped pedicle with a ball probe to rule out cortical breaches. We had some difficulty placing the L5 pedicle screws bilaterally because the pedicle screws were quite small. We then inserted a 5.5 and 6.5 x 40 and 45 millimeter pedicle screw into the L5 and S1 pedicles bilaterally under fluoroscopic guidance. We then palpated along the medial aspect of the pedicles to rule out cortical breaches. There were none. The nerve roots were not injured. We then connected the unilateral pedicle screws with a lordotic rod. We compressed the construct and secured the rod in place with the caps. We then tightened the caps appropriately. This completed the instrumentation from L5-S1.  We now turned our attention to the posterior lateral arthrodesis at L5-S1. We used the high-speed drill to decorticate the remainder of the facets, pars, transverse process at L5-S1. We then applied a combination of local morselized autograft bone and Vitoss bone graft extender over these decorticated posterior lateral structures. This completed the posterior lateral arthrodesis.  We then obtained hemostasis using bipolar electrocautery. We irrigated the wound out with bacitracin solution. We inspected the thecal sac and nerve roots and noted they were well decompressed. We then removed the retractor. We placed a medium Hemovac drain in the epidural space and tunneled out through separate stab wound. We reapproximated patient's thoracolumbar fascia with interrupted #1 Vicryl suture. We reapproximated patient's subcutaneous tissue with interrupted 2-0 Vicryl suture. The reapproximated patient's skin with Steri-Strips and benzoin. The wound was then coated with bacitracin ointment. A sterile  dressing was applied. The drapes were removed. The patient was subsequently returned to the supine position where they were extubated by the anesthesia team. He was then transported to the post anesthesia care unit in stable condition. All sponge instrument and needle counts were reportedly correct at the end of this case.

## 2013-09-03 ENCOUNTER — Inpatient Hospital Stay (HOSPITAL_COMMUNITY): Payer: Worker's Compensation

## 2013-09-03 LAB — URINE MICROSCOPIC-ADD ON

## 2013-09-03 LAB — URINALYSIS, ROUTINE W REFLEX MICROSCOPIC
Bilirubin Urine: NEGATIVE
GLUCOSE, UA: NEGATIVE mg/dL
KETONES UR: NEGATIVE mg/dL
LEUKOCYTES UA: NEGATIVE
Nitrite: NEGATIVE
PH: 7 (ref 5.0–8.0)
PROTEIN: NEGATIVE mg/dL
Specific Gravity, Urine: 1.007 (ref 1.005–1.030)
Urobilinogen, UA: 0.2 mg/dL (ref 0.0–1.0)

## 2013-09-03 LAB — BASIC METABOLIC PANEL
BUN: 15 mg/dL (ref 6–23)
CHLORIDE: 102 meq/L (ref 96–112)
CO2: 24 meq/L (ref 19–32)
Calcium: 8.1 mg/dL — ABNORMAL LOW (ref 8.4–10.5)
Creatinine, Ser: 1.58 mg/dL — ABNORMAL HIGH (ref 0.50–1.35)
GFR calc non Af Amer: 51 mL/min — ABNORMAL LOW (ref >90)
GFR, EST AFRICAN AMERICAN: 59 mL/min — AB (ref >90)
Glucose, Bld: 94 mg/dL (ref 70–99)
Potassium: 3.9 mEq/L (ref 3.7–5.3)
SODIUM: 137 meq/L (ref 137–147)

## 2013-09-03 LAB — CBC
HCT: 35.2 % — ABNORMAL LOW (ref 39.0–52.0)
Hemoglobin: 12.5 g/dL — ABNORMAL LOW (ref 13.0–17.0)
MCH: 30.2 pg (ref 26.0–34.0)
MCHC: 35.5 g/dL (ref 30.0–36.0)
MCV: 85 fL (ref 78.0–100.0)
PLATELETS: 122 10*3/uL — AB (ref 150–400)
RBC: 4.14 MIL/uL — AB (ref 4.22–5.81)
RDW: 12.8 % (ref 11.5–15.5)
WBC: 6.4 10*3/uL (ref 4.0–10.5)

## 2013-09-03 MED ORDER — VANCOMYCIN HCL IN DEXTROSE 1-5 GM/200ML-% IV SOLN
1000.0000 mg | Freq: Two times a day (BID) | INTRAVENOUS | Status: DC
  Administered 2013-09-03 – 2013-09-06 (×6): 1000 mg via INTRAVENOUS
  Filled 2013-09-03 (×7): qty 200

## 2013-09-03 MED ORDER — WHITE PETROLATUM GEL
Status: AC
  Administered 2013-09-03: 16:00:00
  Filled 2013-09-03: qty 5

## 2013-09-03 MED ORDER — ONDANSETRON HCL 4 MG/2ML IJ SOLN
4.0000 mg | Freq: Four times a day (QID) | INTRAMUSCULAR | Status: DC | PRN
  Administered 2013-09-03: 4 mg via INTRAVENOUS
  Filled 2013-09-03: qty 2

## 2013-09-03 MED ORDER — MORPHINE SULFATE 2 MG/ML IJ SOLN: 1.0000 mg | INTRAMUSCULAR | Status: DC | PRN

## 2013-09-03 MED ORDER — ONDANSETRON HCL 4 MG/2ML IJ SOLN
4.0000 mg | Freq: Four times a day (QID) | INTRAMUSCULAR | Status: DC
Start: 2013-09-04 — End: 2013-09-03

## 2013-09-03 NOTE — Evaluation (Signed)
Occupational Therapy Evaluation Patient Details Name: Jose Schultz MRN: [id number] DOB: [birth year] Today's Date: 09/03/2013 Time: 1210-1230 OT Time Calculation (min): 20 min  OT Assessment / Plan / Recommendation History of present illness pt presents with L5-S1 PLIF.     Clinical Impression   Pt presents with below problem list. Pt independent with ADLs, PTA. Pt will benefit from acute OT to increase independence prior to d/c.     OT Assessment  Patient needs continued OT Services    Follow Up Recommendations  Home health OT;Supervision/Assistance - 24 hour    Barriers to Discharge      Equipment Recommendations  3 in 1 bedside comode;Other (comment) (AE kit)    Recommendations for Other Services    Frequency  Min 2X/week    Precautions / Restrictions Precautions Precautions: Fall;Back Precaution Booklet Issued:  (handout in room) Precaution Comments: Reviewed back precautions.   Restrictions Weight Bearing Restrictions: No   Pertinent Vitals/Pain Pain 8.5/10. Increased activity during session. HR in 120's at times and O2 in 80's on RA. Educated on deep breathing. Nurse notified of HR.    ADL  Grooming: Set up;Supervision/safety Where Assessed - Grooming: Supported sitting Upper Body Dressing: Set up;Supervision/safety Where Assessed - Upper Body Dressing: Supported sitting Lower Body Dressing: Maximal assistance Where Assessed - Lower Body Dressing: Supported sit to Lobbyist: Magazine features editor Method: Sit to Loss adjuster, chartered: Other (comment) (from bed) Tub/Shower Transfer Method: Not assessed Equipment Used: Gait belt;Long-handled shoe horn;Long-handled sponge;Reacher;Rolling walker;Sock aid Transfers/Ambulation Related to ADLs: Min guard ADL Comments: Recommended sitting for bathing and dressing. Educated on use of 3 in 1. Educated on AE for LB ADLs.  Practiced with sockaid and reacher.    OT Diagnosis: Acute pain  OT  Problem List: Decreased activity tolerance;Impaired balance (sitting and/or standing);Decreased knowledge of use of DME or AE;Decreased knowledge of precautions;Pain;Decreased range of motion OT Treatment Interventions: Self-care/ADL training;DME and/or AE instruction;Therapeutic activities;Patient/family education;Balance training   OT Goals(Current goals can be found in the care plan section) Acute Rehab OT Goals Patient Stated Goal: get back to work OT Goal Formulation: With patient Time For Goal Achievement: 09/10/13 Potential to Achieve Goals: Good ADL Goals Pt Will Perform Grooming: with modified independence;standing Pt Will Perform Lower Body Bathing: with modified independence;sit to/from stand;with adaptive equipment Pt Will Perform Lower Body Dressing: with modified independence;sit to/from stand;with adaptive equipment Pt Will Transfer to Toilet: with modified independence;ambulating Pt Will Perform Toileting - Clothing Manipulation and hygiene: with modified independence;sit to/from stand Pt Will Perform Tub/Shower Transfer: Tub transfer;with supervision;ambulating;rolling walker (tub equipment tbd) Additional ADL Goal #1: Pt will independently verbalize and demonstrate 3/3 back precautions.  Visit Information  Last OT Received On: 09/03/13 Assistance Needed: +1 History of Present Illness: pt presents with L5-S1 PLIF.         Prior [place] expects to be discharged to:: Private residence Living Arrangements:  (unsure who is going to assist him) Available Help at Discharge: Family;Friend(s) (states he will have someone there all the time) Type of Home: House Home Access: Stairs to enter [employer] of Steps: 2 Entrance Stairs-Rails: None Home Layout: One level Home Equipment: None Prior Function Level of Independence: Independent Communication Communication: No difficulties Dominant Hand: Right          Vision/Perception     Cognition  Cognition Arousal/Alertness: Awake/alert Behavior During Therapy: WFL for tasks assessed/performed Overall Cognitive Status: No family/caregiver present to determine baseline  cognitive functioning Area of Impairment: Attention Current Attention Level: Selective     Extremity/Trunk Assessment Upper Extremity Assessment Upper Extremity Assessment: Overall WFL for tasks assessed Lower Extremity Assessment Lower Extremity Assessment: Defer to PT evaluation     Mobility Bed Mobility Overal bed mobility: Needs Assistance Bed Mobility: Rolling;Sidelying to Sit Rolling: Min guard Sidelying to sit: Min guard General bed mobility comments: cues for techinique. Transfers Overall transfer level: Needs assistance Equipment used: Rolling walker (2 wheeled) Transfers: Sit to/from Stand Sit to Stand: Min guard General transfer comment: cues for technique.     Exercise     Balance     End of Session OT - End of Session Equipment Utilized During Treatment: Gait belt;Rolling walker Activity Tolerance: Patient limited by pain Patient left: in chair Nurse Communication: Other (comment);Mobility status (HR; in chair)  GO     Benito Mccreedy OTR/L [phone] 09/03/2013, 2:51 PM

## 2013-09-03 NOTE — Evaluation (Signed)
Physical Therapy Evaluation Patient Details Name: Jose Schultz MRN: [id number] DOB: Aug 26, 1965 Today's Date: 09/03/2013 Time: [id number] PT Time Calculation (min): 13 min  PT Assessment / Plan / Recommendation History of Present Illness  pt presents with L5-S1 PLIF.    Clinical Impression  Upon PT arrival bed alarm sounding and pt states he was just "gonna take a walk".  Pt ed on need for A for mobility and to call for Nsg, especially as pt was not attending to his IV and drain.  While sitting EOB pt began to c/o feeling nauseated and dizzy.  Pt returned to supine and RN in room.  Will continue to follow.      PT Assessment  Patient needs continued PT services    Follow Up Recommendations  Home health PT;Supervision/Assistance - 24 hour    Does the patient have the potential to tolerate intense rehabilitation      Barriers to Discharge        Equipment Recommendations  Rolling walker with 5" wheels    Recommendations for Other Services     Frequency Min 5X/week    Precautions / Restrictions Precautions Precautions: Fall;Back Precaution Booklet Issued: Yes (comment) Precaution Comments: Reviewed back precautions.   Restrictions Weight Bearing Restrictions: No   Pertinent Vitals/Pain Told PT "Oh, no.  It's not a 10", but told RN "Oh, it's bad like an 8.2."        Mobility  Bed Mobility Overal bed mobility: Needs Assistance Bed Mobility: Sit to Sidelying Sit to sidelying: Min assist General bed mobility comments: A with LEs and cues for back precautions.      Exercises     PT Diagnosis: Difficulty walking  PT Problem List: Decreased activity tolerance;Decreased balance;Decreased mobility;Decreased coordination;Decreased knowledge of use of DME;Decreased knowledge of precautions;Pain PT Treatment Interventions: DME instruction;Gait training;Stair training;Functional mobility training;Therapeutic activities;Therapeutic exercise;Balance training;Patient/family education      PT Goals(Current goals can be found in the care plan section) Acute Rehab PT Goals Patient Stated Goal: None PT Goal Formulation: With patient Time For Goal Achievement: 09/10/13 Potential to Achieve Goals: Good  Visit Information  Last PT Received On: 09/03/13 Assistance Needed: +1 History of Present Illness: pt presents with L5-S1 PLIF.         Prior Functioning  Home Living Family/patient expects to be discharged to:: Private residence Living Arrangements: Other (Comment) (pt vague about who is to A.  ) Available Help at Discharge: Family;Friend(s);Available PRN/intermittently Type of Home: House Home Access: Stairs to enter [employer] CorporationEntrance Stairs-Number of Steps: 2 Entrance Stairs-Rails: None Home Layout: One level Home Equipment: None Additional Comments: pt vague and seeming not to want to answer who will be home assisting him.  When asked to clarify, he just states "someone will be there" and then later mentions his mother will also be coming to help him.   Prior Function Level of Independence: Independent Communication Communication: No difficulties    Cognition  Cognition Arousal/Alertness: Awake/alert Behavior During Therapy: WFL for tasks assessed/performed Overall Cognitive Status: Within Functional Limits for tasks assessed    Extremity/Trunk Assessment Upper Extremity Assessment Upper Extremity Assessment: Defer to OT evaluation Lower Extremity Assessment Lower Extremity Assessment: Overall WFL for tasks assessed   Balance    End of Session PT - End of Session Activity Tolerance:  (Limited by dizziness and nausea.  ) Patient left: in bed;with call bell/phone within reach;with nursing/sitter in room Nurse Communication: Mobility status  GP     Sunny SchleinRitenour, Clerance Umland F, South CarolinaPT [phone] 09/03/2013,  9:13 AM

## 2013-09-03 NOTE — Progress Notes (Signed)
Physical Therapy Treatment Patient Details Name: Jose Schultz MRN: [id number] DOB: 12/13/1965 Today's Date: 09/03/2013 Time: [id number] PT Time Calculation (min): 23 min  PT Assessment / Plan / Recommendation  History of Present Illness pt presents with L5-S1 PLIF.     PT Comments   Returned to pt's room this pm to attempt ambulation since pt had been dizzy and nauseated this am.  Now pt is anxious and restless occasionally twitching one hand and then the other.  Pt repeatedly calling out "Ok" and "[name]" as if they were ticks.  Pt also inappropriate telling this PT that "Hermenia FiscalWe're gonna get buck naked!  [name]" and then as PT was leaving pt began fondling himself.  RN made aware of above.  Also made RN aware that pt's HR at rest was 130's to low 140's and during ambulation increased to a max of 160 with his O2 sats decreasing to 84% on RA.  Pt was on RA on PT's arrival to room.  Will continue to follow for mobility.    Follow Up Recommendations  Home health PT;Supervision/Assistance - 24 hour     Does the patient have the potential to tolerate intense rehabilitation     Barriers to Discharge        Equipment Recommendations  Rolling walker with 5" wheels    Recommendations for Other Services    Frequency Min 5X/week   Progress towards PT Goals Progress towards PT goals: Progressing toward goals  Plan Current plan remains appropriate    Precautions / Restrictions Precautions Precautions: Fall;Back Precaution Comments: Reviewed back precautions.   Restrictions Weight Bearing Restrictions: No   Pertinent Vitals/Pain "It hurts!"  When asked to rate pain.  RN indicates pre-medicated.      Mobility  Bed Mobility Overal bed mobility: Needs Assistance Bed Mobility: Sidelying to Sit Sidelying to sit: Supervision General bed mobility comments: cues for back precautions and safety.   Transfers Overall transfer level: Needs assistance Equipment used: Rolling walker (2 wheeled) Transfers:  Sit to/from Stand Sit to Stand: Min guard General transfer comment: cues for UE use as pt wanted to pull on PT.   Ambulation/Gait Ambulation/Gait assistance: Min guard Ambulation Distance (Feet): 80 Feet Assistive device: Rolling walker (2 wheeled) Gait Pattern/deviations: Step-through pattern;Decreased stride length General Gait Details: pt required 2 rest breaks during ambulation.  pt repeatedly calling out "ok" and "[name]" while ambulating.  pt's HR increased up to 160 during ambulation with O2 sats decreasing to 84% on RA.      Exercises     PT Diagnosis:    PT Problem List:   PT Treatment Interventions:     PT Goals (current goals can now be found in the care plan section) Acute Rehab PT Goals Patient Stated Goal: None Time For Goal Achievement: 09/10/13 Potential to Achieve Goals: Good  Visit Information  Last PT Received On: 09/03/13 Assistance Needed: +1 History of Present Illness: pt presents with L5-S1 PLIF.      Subjective Data  Patient Stated Goal: None   Cognition  Cognition Arousal/Alertness: Awake/alert Behavior During Therapy: Anxious Overall Cognitive Status: Impaired/Different from baseline Area of Impairment: Attention;Memory;Safety/judgement;Awareness;Problem solving Current Attention Level: Selective Memory: Decreased recall of precautions;Decreased short-term memory Safety/Judgement: Decreased awareness of safety Awareness: Emergent Problem Solving: Difficulty sequencing;Requires verbal cues;Requires tactile cues General Comments: pt very restless and anxious.  pt twitching one hand and then the other.  pt with increased RR and HR without any mobility.  pt with "ticks" where he would sniff or  snort and then repeatedly saying "ok" and calling out "woo!".  Spoke with RN who notes pt ahs been like this.  pt also inappropriate at times telling this PT that "[name] get buck naked!  [name]!" and as PT was leaving pt appeared to be fondling himself and  repeating "Ok" and "Uh-huh".      Balance     End of Session PT - End of Session Equipment Utilized During Treatment: Gait belt Activity Tolerance:  (Limited by anxiety) Patient left: in chair;with call bell/phone within reach;with chair alarm set;with nursing/sitter in room Nurse Communication: Mobility status (Increased HR and cognition)   GP     Sunny Schlein, [place] [phone] 09/03/2013, 2:24 PM

## 2013-09-03 NOTE — Care Management Note (Unsigned)
    Page 1 of 1   09/04/2013     2:28:04 PM   CARE MANAGEMENT NOTE 09/04/2013  Patient:  Jose Schultz,Jose Schultz   Account Number:  [id number]  Date Initiated:  09/03/2013  Documentation initiated by:  Elmer BalesOBARGE,COURTNEY  Subjective/Objective Assessment:   Patient admitted a PLIF. Lives at home with spouse     Action/Plan:   Will follow for discharge needs pending PT/OT evals and physician orders   Anticipated DC Date:     Anticipated DC Plan:  HOME W HOME HEALTH SERVICES      DC Planning Services  CM consult      Choice offered to / List presented to:             Status of service:  In process, will continue to follow Medicare Important Message given?   (If response is "NO", the following Medicare IM given date fields will be blank) Date Medicare IM given:   Date Additional Medicare IM given:    Discharge Disposition:    Per UR Regulation:    If discussed at Long Length of Stay Meetings, dates discussed:    Comments:  09/04/13 1030 Elmer Balesourtney Robarge RN, MSN, CM- Spoke with Angelia with worker's comp regarding patient's current plan of care.  Requested clinicals were faxed.   09/03/13 1400 Elmer Balesourtney Robarge RN, MSN, CM- Recieved call from Marylene Landngela [phone]  with worker's comp requesting clinicals be faxed to [phone] to assist with discharge planning. Per Marylene LandAngela, patient's worker's comp claim number is [id number] and will be covered by [employer].  All HH orders and DME need [facility] at [phone].  CM will continue to follow for discharge needs.

## 2013-09-03 NOTE — Consult Note (Signed)
Krista Yeiden Frenkel Student Nurse  [employer]Sonja Wilson EdD 

## 2013-09-03 NOTE — Progress Notes (Signed)
Patient attempted to get OOB with PT. Upon sitting on side of bed he felt nauseous and dizzy. BP stable, zofran given. Assisted with helping him lay back down in bed. Informed him that the morphine may be making him nauseated and contributing to his headache and to back off the pain medication for a little until his symptoms resolve. CO2 monitor and pulse ox in place per protocol. Hemovac may have been pulled out of patient's back slightly as it is no longer holding a charge to suction. No drainage on dressing. Will inform Dr Lovell SheehanJenkins upon rounds. Will continue to monitor for relief of symptoms.

## 2013-09-03 NOTE — Progress Notes (Signed)
Patient ID: Jose Schultz, male   DOB: 1965-11-28, 48 y.o.   MRN: [id number] Subjective:  The patient is alert and pleasant. His back is appropriate sore. He looks well.  Objective: Vital signs in last 24 hours: Temp:  [97 F (36.1 C)-100.7 F (38.2 C)] 98.7 F (37.1 C) (02/05 1017) Pulse Rate:  [92-110] 104 (02/05 1017) Resp:  [12-18] 14 (02/05 1200) BP: (105-148)/(58-101) 124/69 mmHg (02/05 1017) SpO2:  [93 %-100 %] 97 % (02/05 1200) FiO2 (%):  [2 %-98 %] 98 % (02/05 0400) Weight:  [93.2 kg (205 lb 7.5 oz)] 93.2 kg (205 lb 7.5 oz) (02/04 1849)  Intake/Output from previous day: 02/04 0701 - 02/05 0700 In: 3422 [I.V.:3172; IV Piggyback:250] Out: 1345 [Urine:765; Drains:280; Blood:300] Intake/Output this shift:    Physical exam patient is alert and oriented. He is moving his lower extremities well.  Lab Results:  Recent Labs  09/03/13 0615  WBC 6.4  HGB 12.5*  HCT 35.2*  PLT 122*   BMET  Recent Labs  09/03/13 0615  NA 137  K 3.9  CL 102  CO2 24  GLUCOSE 94  BUN 15  CREATININE 1.58*  CALCIUM 8.1*    Studies/Results: Dg Lumbar Spine 2-3 Views  09/02/2013   CLINICAL DATA:  Lumbar spondylolisthesis, radiculopathy, and low back pain. Posterior lumbar interbody fusion.  EXAM: LUMBAR SPINE - 2-3 VIEW; DG C-ARM 61-120 MIN  COMPARISON:  Portable intraoperative radiographs earlier the same day. Lumbar spine radiographs 11/07/2012.  FINDINGS: The vertebral numbering employed on intraoperative radiographs obtained earlier today will be continued, where S1 is considered lumbarized.  Single lateral intraoperative spot fluoroscopic image of the lower lumbar spine is provided. This demonstrates the interval performance of S1-2 posterior lumbar interbody fusion with interbody spacer and pedicle screws. Anterolisthesis of S1 on S2 is unchanged.  IMPRESSION: Intraoperative image during S1-2 PLIF.   Electronically Signed   By: Sebastian Ache   On: 09/02/2013 16:46   Dg Lumbar Spine 2-3  Views  09/02/2013   CLINICAL DATA:  L5-S1 fusion.  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 There are 12 thoracic type vertebral bodies, small ribs at L1 and a transitional partially lumbarized S1 segment. 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  Two cross-table lateral views of the lumbar spine are submitted. Image number 1 at 1142 hr demonstrates skin spreaders posteriorly with a blunt surgical instrument overlying the posterior elements at the transitional S1 level.  Image number 2 at 1149 hr demonstrates the surgical instrument directed towards the posterior aspect of the upper S2 segment.  IMPRESSION: Intraoperative localization views as described. 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 The report from the outside MRI is not available.   Electronically Signed   By: Roxy Horseman M.D.   On: 09/02/2013 15:22   Dg C-arm 61-120 Min  09/02/2013   CLINICAL DATA:  Lumbar spondylolisthesis, radiculopathy, and low back pain. Posterior lumbar interbody fusion.  EXAM: LUMBAR SPINE - 2-3 VIEW; DG C-ARM 61-120 MIN  COMPARISON:  Portable intraoperative radiographs earlier the same day. Lumbar spine radiographs 11/07/2012.  FINDINGS: The vertebral numbering employed on intraoperative radiographs obtained earlier today  will be continued, where S1 is considered lumbarized.  Single lateral intraoperative spot fluoroscopic image of the lower lumbar spine is provided. This demonstrates the interval performance of S1-2 posterior lumbar interbody fusion with interbody spacer and pedicle screws. Anterolisthesis of S1 on S2 is unchanged.  IMPRESSION:  Intraoperative image during S1-2 PLIF.   Electronically Signed   By: Sebastian AcheAllen  Grady   On: 09/02/2013 16:46    Assessment/Plan: Postop day 1: We will mobilize him. I will plan to discontinue his PCA pump and Hemovac tomorrow.  LOS: 1 day     Kendi Defalco D 09/03/2013, 12:49 PM

## 2013-09-03 NOTE — Progress Notes (Signed)
Assessed patient and noticed his hemovac was not charging. Checked incision and hemovac had been pulled out by patient during ambulation.  40cc emptied from hemovac.  Removed hemovac completely. Placed new gauze dressing as old one was stained from oozing at hemovac site. Site currently is clean, dry, intact. Patients heart rate when ambulating with PT went up to 140s. Very anxious and jittery patients states this is due to him "hurting very badly" when moving, A/Ox4, neuro intact.  Encouraged patient to lay back in bed and try to relax. Heart rate does not sustain in 140s it is usually 100-120 at baseline even at rest. Patient states this is normal for him. O2 sats are at 97% RA when patient lying down. Will continue to monitor and encourage patient to work on relaxation techniques and proper body mechanics.

## 2013-09-03 NOTE — Progress Notes (Signed)
Patient is confused, agitated at times, has temp of 102 and is nauseous. Dr. Lovell SheehanJenkins Notified. Recived new orders for CXR, UA, urine culture, Blood Culture X 2. Zofran prn for Nausea. See MAR.

## 2013-09-03 NOTE — Progress Notes (Signed)
UR complete.  Roger Kettles RN, MSN 

## 2013-09-03 NOTE — Progress Notes (Addendum)
Patient ambulated within the room with RN -  no order for brace and not fitted for one. Tolerated fairly with walker. Had lot of pain on Back radiating to thighs. Also was slightly dizzi towards the end. Hemovac drained 230 ml. Foley D/cd.  K James/ RN, [employer]

## 2013-09-03 NOTE — Anesthesia Postprocedure Evaluation (Signed)
  Anesthesia Post-op Note  Patient: Jose Schultz  Procedure(s) Performed: Procedure(s) with comments: LUMBAR FIVE TO SACRAL ONE POSTERIOR LUMBAR FUSION 1 LEVEL (N/A) - L5S1 posterior lumbar fusion with interbody prosthesis and posterior lateral arthrodesis with posterior nonsegmental instrumentation  Patient Location: PACU  Anesthesia Type:General  Level of Consciousness: awake, alert , oriented and patient cooperative  Airway and Oxygen Therapy: Patient Spontanous Breathing  Post-op Pain: mild  Post-op Assessment: Post-op Vital signs reviewed, Patient's Cardiovascular Status Stable, Respiratory Function Stable, Patent Airway, No signs of Nausea or vomiting and Pain level controlled  Post-op Vital Signs: stable  Complications: No apparent anesthesia complications

## 2013-09-04 MED ORDER — OXYCODONE HCL 5 MG PO TABS
5.0000 mg | ORAL_TABLET | ORAL | Status: DC | PRN
  Administered 2013-09-04 – 2013-09-06 (×5): 5 mg via ORAL
  Administered 2013-09-06 (×2): 10 mg via ORAL
  Filled 2013-09-04: qty 1
  Filled 2013-09-04: qty 2
  Filled 2013-09-04 (×3): qty 1
  Filled 2013-09-04: qty 2
  Filled 2013-09-04: qty 1
  Filled 2013-09-04: qty 2

## 2013-09-04 MED FILL — Heparin Sodium (Porcine) Inj 1000 Unit/ML: INTRAMUSCULAR | Qty: 30 | Status: AC

## 2013-09-04 MED FILL — Sodium Chloride IV Soln 0.9%: INTRAVENOUS | Qty: 1000 | Status: AC

## 2013-09-04 NOTE — Progress Notes (Signed)
Pt continues to have fever. Tylenol given. Patient refused to use IS. Pt had a bath, applied ice packs. MD aware of fever. Urine and blood cultures pending. Temperature getting better than before. Will continue to monitor.

## 2013-09-04 NOTE — Progress Notes (Signed)
Physical Therapy Treatment Patient Details Name: Jose Schultz MRN: [id number] DOB: 12/02/1965 Today's Date: 09/04/2013 Time: [id number] PT Time Calculation (min): 15 min  PT Assessment / Plan / Recommendation  History of Present Illness pt presents with L5-S1 PLIF.     PT Comments   Pt less anxious today for Pt than yesterday and more appropriate.  Per RN pt was given a Valium a little before PT arrived and seemed to help with anxiety and behavior.  Pt seems to have no recollection of events yesterday as he did not recall ever walking yesterday.  Will continue to follow.    Follow Up Recommendations  Home health PT;Supervision/Assistance - 24 hour     Does the patient have the potential to tolerate intense rehabilitation     Barriers to Discharge        Equipment Recommendations  Rolling walker with 5" wheels    Recommendations for Other Services    Frequency Min 5X/week   Progress towards PT Goals Progress towards PT goals: Progressing toward goals  Plan Current plan remains appropriate    Precautions / Restrictions Precautions Precautions: Fall;Back Precaution Comments: Reviewed back precautions.   Restrictions Weight Bearing Restrictions: No   Pertinent Vitals/Pain Pt indicates only headache.      Mobility  Bed Mobility Overal bed mobility: Needs Assistance (Simultaneous filing. User may not have seen previous data.) Bed Mobility: Rolling;Sidelying to Sit;Sit to Sidelying (Simultaneous filing. User may not have seen previous data.) Rolling: Supervision (Simultaneous filing. User may not have seen previous data.) Sidelying to sit: Supervision (Simultaneous filing. User may not have seen previous data.) Sit to sidelying: Supervision (Simultaneous filing. User may not have seen previous data.) General bed mobility comments: cues for technique and back precautions.   (Simultaneous filing. User may not have seen previous data.) Transfers Overall transfer level: Needs  assistance (Simultaneous filing. User may not have seen previous data.) Equipment used: Rolling walker (2 wheeled) (Simultaneous filing. User may not have seen previous data.) Transfers: Sit to/from Stand Sit to Stand: Min guard General transfer comment: cues for technique. Ambulation/Gait Ambulation/Gait assistance: Min guard Ambulation Distance (Feet): 60 Feet Assistive device: Rolling walker (2 wheeled) Gait Pattern/deviations: Step-through pattern;Decreased stride length General Gait Details: pt seems to fatigue more quickly today possibly 2/2 Valium, but continues to move well.  cues given for use of RW, upright posture, and back precautions.      Exercises     PT Diagnosis:    PT Problem List:   PT Treatment Interventions:     PT Goals (current goals can now be found in the care plan section) Acute Rehab PT Goals Time For Goal Achievement: 09/10/13 Potential to Achieve Goals: Good  Visit Information  Last PT Received On: 09/04/13 Assistance Needed: +1 History of Present Illness: pt presents with L5-S1 PLIF.      Subjective Data      Cognition  Cognition Arousal/Alertness: Awake/alert Behavior During Therapy: Restless Overall Cognitive Status: No family/caregiver present to determine baseline cognitive functioning Area of Impairment: Attention;Memory;Safety/judgement;Awareness;Problem solving Current Attention Level: Selective Memory: Decreased short-term memory;Decreased recall of precautions Safety/Judgement: Decreased awareness of safety Awareness: Emergent Problem Solving: Difficulty sequencing;Requires verbal cues;Requires tactile cues General Comments: pt with no recollection of ambulating yesterday and simply states "My girlfriend told me I was acting out yesterday, so I'm sorry."  pt continues to be restless today, however better than yesterday and more appropriate.  Per RN pt had a Valium shortly before PT arrival.  Balance     End of Session PT - End  of Session Equipment Utilized During Treatment: Gait belt Activity Tolerance: Patient tolerated treatment well Patient left: in bed;with call bell/phone within reach;with bed alarm set Nurse Communication: Mobility status   GP     Sunny Schlein, [place] [phone] 09/04/2013, 10:29 AM

## 2013-09-04 NOTE — Progress Notes (Signed)
Patient's Fiance called this am and talked to nurse in phone, to check on patient and said patient has some mental issues at times, with anxiety or panic attack, but unaware of any related diagnosis.   K James/ RN, [employer]

## 2013-09-04 NOTE — Progress Notes (Signed)
Patient ID: Jose Schultz, male   DOB: 18-Sep-1965, 48 y.o.   MRN: [id number] Subjective:  The patient is alert and pleasant. By report at times he has been confused.  Objective: Vital signs in last 24 hours: Temp:  [98.7 F (37.1 C)-103.5 F (39.7 C)] 98.9 F (37.2 C) (02/06 0514) Pulse Rate:  [100-134] 123 (02/06 0514) Resp:  [14-24] 20 (02/06 0514) BP: (119-162)/(69-105) 162/105 mmHg (02/06 0514) SpO2:  [93 %-100 %] 100 % (02/06 0514)  Intake/Output from previous day: 02/05 0701 - 02/06 0700 In: -  Out: 2040 [Urine:2000; Drains:40] Intake/Output this shift: Total I/O In: -  Out: 300 [Urine:300]  Physical exam the patient is alert and pleasant. He is moving all 4 extremities well. He gives away to pain very easily making accurate determination of his strength difficult. His dressing is clean and dry.  Lab Results:  Recent Labs  09/03/13 0615  WBC 6.4  HGB 12.5*  HCT 35.2*  PLT 122*   BMET  Recent Labs  09/03/13 0615  NA 137  K 3.9  CL 102  CO2 24  GLUCOSE 94  BUN 15  CREATININE 1.58*  CALCIUM 8.1*    Studies/Results: Dg Lumbar Spine 2-3 Views  09/02/2013   CLINICAL DATA:  Lumbar spondylolisthesis, radiculopathy, and low back pain. Posterior lumbar interbody fusion.  EXAM: LUMBAR SPINE - 2-3 VIEW; DG C-ARM 61-120 MIN  COMPARISON:  Portable intraoperative radiographs earlier the same day. Lumbar spine radiographs 11/07/2012.  FINDINGS: The vertebral numbering employed on intraoperative radiographs obtained earlier today will be continued, where S1 is considered lumbarized.  Single lateral intraoperative spot fluoroscopic image of the lower lumbar spine is provided. This demonstrates the interval performance of S1-2 posterior lumbar interbody fusion with interbody spacer and pedicle screws. Anterolisthesis of S1 on S2 is unchanged.  IMPRESSION: Intraoperative image during S1-2 PLIF.   Electronically Signed   By: Sebastian AcheAllen  Grady   On: 09/02/2013 16:46   Dg Lumbar Spine  2-3 Views  09/02/2013   CLINICAL DATA:  L5-S1 fusion.  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 There are 12 thoracic type vertebral bodies, small ribs at L1 and a transitional partially lumbarized S1 segment. 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  Two cross-table lateral views of the lumbar spine are submitted. Image number 1 at 1142 hr demonstrates skin spreaders posteriorly with a blunt surgical instrument overlying the posterior elements at the transitional S1 level.  Image number 2 at 1149 hr demonstrates the surgical instrument directed towards the posterior aspect of the upper S2 segment.  IMPRESSION: Intraoperative localization views as described. 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 The report from the outside MRI is not available.   Electronically Signed   By: Roxy HorsemanBill  Veazey M.D.   On: 09/02/2013 15:22   Dg Chest Port 1 View  09/03/2013   CLINICAL DATA:  Fever.  EXAM: PORTABLE CHEST - 1 VIEW  COMPARISON:  PA and lateral chest 11/10/2010.  FINDINGS: Lungs are clear. Heart size is normal. No pneumothorax or pleural effusion.  IMPRESSION: No acute disease.   Electronically Signed   By: Drusilla Kannerhomas  Dalessio M.D.   On:  09/03/2013 22:39   Dg C-arm 61-120 Min  09/02/2013   CLINICAL DATA:  Lumbar spondylolisthesis, radiculopathy, and low back pain. Posterior lumbar interbody fusion.  EXAM: LUMBAR SPINE - 2-3 VIEW; DG C-ARM 61-120 MIN  COMPARISON:  Portable intraoperative radiographs earlier the same day. Lumbar spine radiographs 11/07/2012.  FINDINGS: The vertebral numbering employed on intraoperative radiographs obtained earlier  today will be continued, where S1 is considered lumbarized.  Single lateral intraoperative spot fluoroscopic image of the lower lumbar spine is provided. This demonstrates the interval performance of S1-2 posterior lumbar interbody fusion with interbody spacer and pedicle screws. Anterolisthesis of S1 on S2 is unchanged.  IMPRESSION: Intraoperative image during S1-2 PLIF.   Electronically Signed   By: Sebastian Ache   On: 09/02/2013 16:46    Assessment/Plan: Postop day #2: We will continue to mobilize the patient with PT and OT.  Fevers: The patient's chest x-ray and urinalysis is okay. We will continue observation for now.  LOS: 2 days     Martie Fulgham D 09/04/2013, 8:00 AM
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
## 2013-09-05 LAB — URINE CULTURE
CULTURE: NO GROWTH
Colony Count: NO GROWTH

## 2013-09-05 NOTE — Progress Notes (Signed)
Patient ID: Jose Schultz, male   DOB: 02-25-1966, 48 y.o.   MRN: [id number] BP 151/88  Pulse 114  Temp(Src) 100.7 F (38.2 C) (Oral)  Resp 24  Ht 5' 8.11" (1.73 m)  Wt 93.2 kg (205 lb 7.5 oz)  BMI 31.14 kg/m2  SpO2 100% Alert, oriented x 4 Moving all extremities well Wound is clean, flat, dry, no signs of infection Urine culture is negative, blood culture is pending No explanation for seizure like Activity. Fiance states that he is acting normally this morning, first time since Thursday evening by her reckoning. Will monitor closely.

## 2013-09-05 NOTE — Progress Notes (Signed)
Physical Therapy Treatment Patient Details Name: Jose Schultz MRN: [id number] DOB: [birth year] Today's Date: 09/05/2013 Time: [id number] PT Time Calculation (min): 15 min  PT Assessment / Plan / Recommendation  History of Present Illness pt presents with L5-S1 PLIF.     PT Comments   Pt making great progress with mobility with stair education completed today.  Follow Up Recommendations  Home health PT;Supervision/Assistance - 24 hour     Equipment Recommendations  Rolling walker with 5" wheels    Frequency Min 5X/week   Progress towards PT Goals Progress towards PT goals: Progressing toward goals  Plan Current plan remains appropriate    Precautions / Restrictions Precautions Precautions: Fall;Back Precaution Comments: Reeducated pt on back precautions as he only recalled 2/3 Restrictions Weight Bearing Restrictions: No       Mobility  Bed Mobility General bed mobility comments: not assessed: pt in hall ambulating with NT when session started (sitter/NT relieved by PTA) and to chair after session. Transfers Overall transfer level: Needs assistance Transfers: Sit to/from Stand Sit to Stand: Min guard General transfer comment: cues for hand placement and use to control descent with sitting to recliner. Ambulation/Gait Ambulation/Gait assistance: Supervision Ambulation Distance (Feet): 200 Feet Assistive device: Rolling walker (2 wheeled) Gait Pattern/deviations: Step-through pattern;Decreased stride length;Narrow base of support Gait velocity: decreased Gait velocity interpretation: Below normal speed for age/gender General Gait Details: occasional cues needed for posture and walker position with gait. Stairs: Yes Stairs assistance: Min guard Stair Management: Two rails;Step to pattern;Forwards Number of Stairs: 5 General stair comments: cues on sequence with stairs.      PT Goals (current goals can now be found in the care plan section) Acute Rehab PT  Goals Patient Stated Goal: get back to work PT Goal Formulation: With patient Time For Goal Achievement: 09/10/13 Potential to Achieve Goals: Good  Visit Information  Last PT Received On: 09/05/13 Assistance Needed: +1 History of Present Illness: pt presents with L5-S1 PLIF.      Subjective Data  Patient Stated Goal: get back to work   Cognition  Cognition Arousal/Alertness: Awake/alert Behavior During Therapy: WFL for tasks assessed/performed Overall Cognitive Status: Within Functional Limits for tasks assessed General Comments: Pt alert and oriented today. No recall of first day with therapy and some recall of yesterday. Reports what he has been told he did and continues to apologize for that behavior. Oriented x4 today.    End of Session PT - End of Session Equipment Utilized During Treatment: Gait belt Activity Tolerance: Patient tolerated treatment well Patient left: in chair;with call bell/phone within reach;with nursing/sitter in room Nurse Communication: Mobility status   GP     Sallyanne KusterBury, Syd Manges 09/05/2013, 2:12 PM  Sallyanne KusterKathy Charonda Hefter, PTA Office- [phone]

## 2013-09-05 NOTE — Progress Notes (Signed)
Patient had 3 consecutive episodes of seizure activity. First one was starring at air lasted for 1 minute. In few seconds he started starring again and stiff body lasted for 45 seconds. Started responding to questions but in few seconds  started rolling his eyes, stiffening of body and started slight jerking whole body. Lasted for 1.5 minutes. No drooling of saliva/incontinence. Patient alert and oriented X4 after the episode. Vitals are stable. Blood pressure 127/87, Temp 98.1, Heart rate 96 and 99% on RA. Denies any h/o seizure. Doctor Elsner notified. No new orders at this time.Told to call doctor if it happens again. Will continue to monitor.

## 2013-09-06 LAB — VANCOMYCIN, TROUGH: Vancomycin Tr: 6 ug/mL — ABNORMAL LOW (ref 10.0–20.0)

## 2013-09-06 MED ORDER — DIAZEPAM 5 MG PO TABS: 5.0000 mg | ORAL_TABLET | Freq: Four times a day (QID) | ORAL | Status: DC | PRN

## 2013-09-06 MED ORDER — OXYCODONE HCL 5 MG PO TABS: 5.0000 mg | ORAL_TABLET | ORAL | Status: DC | PRN

## 2013-09-06 NOTE — Progress Notes (Signed)
ANTIBIOTIC CONSULT NOTE - FOLLOW UP  Pharmacy Consult for Vancomycin Indication: post-op spinal drain prophx, now with fevers  Allergies  Allergen Reactions  . Penicillins Diarrhea  . Bee Venom   . Other     [place]    Patient Measurements: Height: 5' 8.11" (173 cm) Weight: 205 lb 7.5 oz (93.2 kg) IBW/kg (Calculated) : 68.65 Adjusted Body Weight:   Vital Signs: Temp: 99.4 F (37.4 C) (02/08 1030) Temp src: Oral (02/08 0507) BP: 130/68 mmHg (02/08 0507) Pulse Rate: 98 (02/08 0507) Intake/Output from previous day: 02/07 0701 - 02/08 0700 In: 600 [P.O.:600] Out: 2475 [Urine:2475] Intake/Output from this shift: Total I/O In: 320 [P.O.:320] Out: 400 [Urine:400]  Labs: No results found for this basename: WBC, HGB, PLT, LABCREA, CREATININE,  in the last 72 hours Estimated Creatinine Clearance: 65.6 ml/min (by C-G formula based on Cr of 1.58).  Recent Labs  09/06/13 1025  VANCOTROUGH 6.0*     Microbiology: Recent Results (from the past 720 hour(s))  SURGICAL PCR SCREEN     Status: None   Collection Time    08/28/13  9:12 AM      Result Value Range Status   MRSA, PCR NEGATIVE  NEGATIVE Final   Staphylococcus aureus NEGATIVE  NEGATIVE Final   Comment:            The Xpert SA Assay (FDA     approved for NASAL specimens     in patients over 29 years of age),     is one component of     a comprehensive surveillance     program.  Test performance has     been validated by [employer] for patients greater     than or equal to 43 year old.     It is not intended     to diagnose infection nor to     guide or monitor treatment.  CULTURE, BLOOD (ROUTINE X 2)     Status: None   Collection Time    09/03/13 11:00 PM      Result Value Range Status   Specimen Description BLOOD RIGHT ARM   Final   Special Requests     Final   Value: BOTTLES DRAWN AEROBIC AND ANAEROBIC 10CC PT ON VANC   Culture  Setup Time     Final   Value: 09/04/2013 03:28     Performed at  [facility]   Culture     Final   Value:        BLOOD CULTURE RECEIVED NO GROWTH TO DATE CULTURE WILL BE HELD FOR 5 DAYS BEFORE ISSUING A FINAL NEGATIVE REPORT     Performed at [facility]   Report Status PENDING   Incomplete  CULTURE, BLOOD (ROUTINE X 2)     Status: None   Collection Time    09/03/13 11:05 PM      Result Value Range Status   Specimen Description BLOOD RIGHT HAND   Final   Special Requests BOTTLES DRAWN AEROBIC ONLY [facility]   Final   Culture  Setup Time     Final   Value: 09/04/2013 03:28     Performed at [facility]   Culture     Final   Value:        BLOOD CULTURE RECEIVED NO GROWTH TO DATE CULTURE WILL BE HELD FOR 5 DAYS BEFORE ISSUING A FINAL NEGATIVE REPORT     Performed at [facility]  Report Status PENDING   Incomplete  URINE CULTURE     Status: None   Collection Time    09/03/13 11:09 PM      Result Value Range Status   Specimen Description URINE, RANDOM   Final   Special Requests NONE   Final   Culture  Setup Time     Final   Value: 09/04/2013 03:55     [facility]   Colony Count     Final   Value: NO GROWTH     [facility] at [facility]   Culture     Final   Value: NO GROWTH     [facility] at [facility]   Report Status 09/05/2013 FINAL   Final    Anti-infectives   Start     Dose/Rate Route Frequency Ordered Stop   09/03/13 2200  vancomycin (VANCOCIN) IVPB 1000 mg/200 mL premix     1,000 mg 200 mL/hr over 60 Minutes Intravenous Every 12 hours 09/03/13 0956     09/02/13 2200  vancomycin (VANCOCIN) 1,250 mg in sodium chloride 0.9 % 250 mL IVPB  Status:  Discontinued     1,250 mg 166.7 mL/hr over 90 Minutes Intravenous Every 12 hours 09/02/13 2027 09/03/13 0956   09/02/13 1143  bacitracin 50,000 Units in sodium chloride irrigation 0.9 % 500 mL irrigation  Status:  Discontinued       As needed 09/02/13 1143 09/02/13 1652   09/02/13 0600  vancomycin (VANCOCIN) IVPB 1000 mg/200 mL  premix     1,000 mg 200 mL/hr over 60 Minutes Intravenous On call to O.R. 09/01/13 1429 09/02/13 1105      Assessment:c: 48 yr old male suffered a back injury at work for surgical decompression and fusion of L5-S1. Pharmacy has been asked to dose vancomycin for surgical prophylaxis. Pt has a drain>>removed but now with fevers.  Anticoag: Plts dropping down to 122. SCDs  ID: Drain dced, but with fevers.Tmax 100.7 improved. WBC 6.8. Scr = 1.58. No sign of infection in wound per MD note. UCs negative. BC x 2 still pending  Vanco 2/4>> 2/8: Vanco trough: 6 (drawn 1 hr late)  Neuro: 3 consecutive episodes of seizure activity overnight of unknown origin.   Goal of Therapy:  Vancomycin trough level 10-15 mcg/ml  Plan:  Plan discharge today. No change   Blease Capaldi S. Merilynn Finlandobertson, PharmD, BCPS Clinical Staff Pharmacist Pager [phone]  Misty Stanleyobertson, Satchel Heidinger Stillinger 09/06/2013,11:42 AM

## 2013-09-06 NOTE — Progress Notes (Signed)
Ambulated 1 assist with walker down hallway and back with RN, back to chair with call bell in reach and chair alarm set. Patient in a pleasant mood today. Will cont to monitor

## 2013-09-06 NOTE — Discharge Summary (Signed)
Physician Discharge Summary  Patient ID: Jose Schultz MRN: [id number] DOB/AGE: [birth year] 47 y.o.  Admit date: 09/02/2013 Discharge date: 09/06/2013  Admission Diagnoses: Spondylolisthesis of lumbar spine  Discharge Diagnoses: Spondylolisthesis of lumbar spine, lumbar radiculopathy, back pain. Active Problems:   Spondylolisthesis of lumbar region   Discharged Condition: good  Hospital Course: Patient was admitted to undergo surgical decompression. Postoperatively he had some substantial pain, he started to ambulate independently. His incision has remained clean and dry.  Consults: None  Significant Diagnostic Studies: None  Treatments: surgery: Lumbar decompression and fusion  Discharge Exam: Blood pressure 130/68, pulse 98, temperature 99.7 F (37.6 C), temperature source Oral, resp. rate 20, height 5' 8.11" (1.73 m), weight 93.2 kg (205 lb 7.5 oz), SpO2 98.00%. Incision is clean dry. Motor function is intact in both lower extremities.  Disposition: 01-Home or Self Care  Discharge Orders   Future Orders Complete By Expires   Call MD for:  redness, tenderness, or signs of infection (pain, swelling, redness, odor or green/yellow discharge around incision site)  As directed    Call MD for:  severe uncontrolled pain  As directed    Call MD for:  temperature >100.4  As directed    Diet - low sodium heart healthy  As directed    Increase activity slowly  As directed        Medication List         cyclobenzaprine 10 MG tablet  Commonly known as:  FLEXERIL  Take 1 tablet (10 mg total) by mouth 2 (two) times daily as needed for muscle spasms.     diazepam 5 MG tablet  Commonly known as:  VALIUM  Take 1 tablet (5 mg total) by mouth every 6 (six) hours as needed for muscle spasms.     HYDROcodone-acetaminophen 5-325 MG per tablet  Commonly known as:  NORCO/VICODIN  Take 1 tablet by mouth every 6 (six) hours as needed for moderate pain.     naproxen 375 MG tablet   Commonly known as:  NAPROSYN  Take 375 mg by mouth 2 (two) times daily with a meal.     oxyCODONE 5 MG immediate release tablet  Commonly known as:  Oxy IR/ROXICODONE  Take 1-2 tablets (5-10 mg total) by mouth every 4 (four) hours as needed for severe pain.         SignedStefani Dama: Mieka Leaton J 09/06/2013, 10:25 AM

## 2013-09-06 NOTE — Progress Notes (Signed)
   CARE MANAGEMENT NOTE 09/06/2013  Patient:  Jose Schultz,Jose Schultz   Account Number:  [id number]  Date Initiated:  09/03/2013  Documentation initiated by:  Jose Schultz  Subjective/Objective Assessment:   Patient admitted a PLIF. Lives at home with spouse     Action/Plan:   Will follow for discharge needs pending PT/OT evals and physician orders   Anticipated DC Date:     Anticipated DC Plan:  HOME W HOME HEALTH SERVICES      DC Planning Services  CM consult      Choice offered to / List presented to:             Status of service:  Completed, signed off Medicare Important Message given?   (If response is "NO", the following Medicare IM given date fields will be blank) Date Medicare IM given:   Date Additional Medicare IM given:    Discharge Disposition:  HOME W HOME HEALTH SERVICES  Per UR Regulation:    If discussed at Long Length of Stay Meetings, dates discussed:    Comments:  09/06/13 12:45 CM verified with pt Jose Schultz's number for contact for arrangement of DME and HHservices.  CM called Jose Schultz who called back and left message requesting DC summary, facesheet, HH orders, evals, H[facility]P [facility]  RN called to verify RW was delivered to room prior to discharge.  No other CM needs were communicated. Jose Schultz, BSN, [place] [phone].    09/04/13 1030 Jose Balesourtney Robarge RN, MSN, CM- Spoke with Jose Schultz with worker's comp regarding patient's current plan of care.  Requested clinicals were faxed.   09/03/13 1400 Jose Balesourtney Robarge RN, MSN, CM- Recieved call from Jose Schultz  with worker's comp requesting clinicals be faxed to [phone] to assist with discharge planning. Per Jose Schultz, patient's worker's comp claim number is [id number] and will be covered by [employer].  All HH orders and DME need to be requested through [facility] at [phone].  CM will continue to follow for discharge needs.

## 2013-09-06 NOTE — Progress Notes (Signed)
Occupational Therapy Treatment Patient Details Name: Jose Schultz MRN: [id number] DOB: Apr 26, 1966 Today's Date: 09/06/2013 Time: [id number] OT Time Calculation (min): 13 min  OT Assessment / Plan / Recommendation  History of present illness pt presents with L5-S1 PLIF.     OT comments  Pt. Up in room with rw upon arrival.  Alert and oriented.  Focused and does not present with any of his previous erratic behavior.  Able to safely demonstrate all aspects of toileting, and tub transfer while verbalizing and demonstrating 3/3 back precautions.  States he will have assistance with all LB self care as he required that prior to admit.  Acute O.T. Goals met, will inform evaluating therapist for d/c from our stand point.   Follow Up Recommendations  Supervision/Assistance - 24 hour;Home health OT           Equipment Recommendations  3 in 1 bedside comode        Frequency Min 2X/week   Progress towards OT Goals Progress towards OT goals: Goals met/education completed, patient discharged from [place] Discharge plan remains appropriate    Precautions / Restrictions Precautions Precautions: Fall;Back Precaution Comments: pt. able to state 3/3 precautions Restrictions Weight Bearing Restrictions: No   Pertinent Vitals/Pain 0/10 per pt.    ADL  Grooming: Simulated;Wash/dry hands;Modified independent;Supervision/safety Where Assessed - Grooming: Unsupported standing Toilet Transfer: Performed;Modified independent Toilet Transfer Method: Sit to Loss adjuster, chartered: Comfort height toilet Toileting - Clothing Manipulation and Hygiene: Simulated;Modified independent Where Assessed - Toileting Clothing Manipulation and Hygiene: Sit on 3-in-1 or toilet Tub/Shower Transfer: Simulated;Supervision/safety Transfers/Ambulation Related to ADLs: s/mod i with use of rw ADL Comments: recalled 3/3 precautions, completed all aspects of toileting with s/mod i.  sim. tub transfer with trash  can, s. no inst. cues needed.  will have family assist with lb dressing as he states he did prior to sx. improved safety, no impusiveness noted.       OT Goals(current goals can now be found in the care plan section)    Visit Information  Last OT Received On: 09/06/13 History of Present Illness: pt presents with L5-S1 PLIF.      Subjective Data   "i'm good, i really feel so much better, maybe i just needed to rest"          Cognition  Cognition Arousal/Alertness: Awake/alert Behavior During Therapy: WFL for tasks assessed/performed Overall Cognitive Status: Within Functional Limits for tasks assessed General Comments: alert and oriented today.  calm, no sporatic thoughts, visible anxiety, or perseveration and decreased safety.      Mobility  Transfers Overall transfer level: Modified independent Equipment used: Rolling walker (2 wheeled) Transfers: Sit to/from Stand Sit to Stand: Modified independent (Device/Increase time)              End of Session OT - End of Session Equipment Utilized During Treatment: Rolling walker Activity Tolerance: Patient tolerated treatment well Patient left: in chair;with call bell/phone within reach;with chair alarm set       Janice Coffin, COTA/L 09/06/2013, 9:10 AM

## 2013-09-06 NOTE — Progress Notes (Signed)
Spoke to phlebotomy who confirmed they drew the vanc trough. Will hang vanc now

## 2013-09-06 NOTE — Progress Notes (Signed)
CSM called for HHPT set up and RW

## 2013-09-10 LAB — CULTURE, BLOOD (ROUTINE X 2)
CULTURE: NO GROWTH
Culture: NO GROWTH

## 2013-11-10 ENCOUNTER — Ambulatory Visit: Payer: Self-pay

## 2014-01-19 ENCOUNTER — Ambulatory Visit: Payer: Self-pay | Attending: Internal Medicine

## 2014-01-20 ENCOUNTER — Emergency Department (INDEPENDENT_AMBULATORY_CARE_PROVIDER_SITE_OTHER)
Admission: EM | Admit: 2014-01-20 | Discharge: 2014-01-20 | Disposition: A | Discharge status: Home / Self Care | Payer: Self-pay | Source: Home / Self Care | Attending: Family Medicine | Admitting: Family Medicine

## 2014-01-20 ENCOUNTER — Encounter (HOSPITAL_COMMUNITY): Payer: Self-pay | Admitting: Emergency Medicine

## 2014-01-20 DIAGNOSIS — I10 Essential (primary) hypertension: Secondary | ICD-10-CM

## 2014-01-20 LAB — POCT I-STAT, CHEM 8
BUN: 9 mg/dL (ref 6–23)
Calcium, Ion: 1.26 mmol/L — ABNORMAL HIGH (ref 1.12–1.23)
Chloride: 105 mEq/L (ref 96–112)
Creatinine, Ser: 1.3 mg/dL (ref 0.50–1.35)
GLUCOSE: 93 mg/dL (ref 70–99)
HEMATOCRIT: 49 % (ref 39.0–52.0)
HEMOGLOBIN: 16.7 g/dL (ref 13.0–17.0)
POTASSIUM: 4.4 meq/L (ref 3.7–5.3)
SODIUM: 143 meq/L (ref 137–147)
TCO2: 26 mmol/L (ref 0–100)

## 2014-01-20 MED ORDER — HYDROCHLOROTHIAZIDE 12.5 MG PO TABS: 12.5000 mg | ORAL_TABLET | Freq: Every day | ORAL | Status: DC

## 2014-01-20 NOTE — Discharge Instructions (Signed)
Thank you for coming in today. Call or go to the emergency room if you get worse, have trouble breathing, have chest pains, or palpitations.    Hypertension Hypertension, commonly called high blood pressure, is when the force of blood pumping through your arteries is too strong. Your arteries are the blood vessels that carry blood from your heart throughout your body. A blood pressure reading consists of a higher number over a lower number, such as 110/72. The higher number (systolic) is the pressure inside your arteries when your heart pumps. The lower number (diastolic) is the pressure inside your arteries when your heart relaxes. Ideally you want your blood pressure below 120/80. Hypertension forces your heart to work harder to pump blood. Your arteries may become narrow or stiff. Having hypertension puts you at risk for heart disease, stroke, and other problems.  RISK FACTORS Some risk factors for high blood pressure are controllable. Others are not.  Risk factors you cannot control include:   Race. You may be at higher risk if you are African American.  Age. Risk increases with age.  Gender. Men are at higher risk than women before age 46 years. After age 46, women are at higher risk than men. Risk factors you can control include:  Not getting enough exercise or physical activity.  Being overweight.  Getting too much fat, sugar, calories, or salt in your diet.  Drinking too much alcohol. SIGNS AND SYMPTOMS Hypertension does not usually cause signs or symptoms. Extremely high blood pressure (hypertensive crisis) may cause headache, anxiety, shortness of breath, and nosebleed. DIAGNOSIS  To check if you have hypertension, your health care provider will measure your blood pressure while you are seated, with your arm held at the level of your heart. It should be measured at least twice using the same arm. Certain conditions can cause a difference in blood pressure between your right and  left arms. A blood pressure reading that is higher than normal on one occasion does not mean that you need treatment. If one blood pressure reading is high, ask your health care provider about having it checked again. TREATMENT  Treating high blood pressure includes making lifestyle changes and possibly taking medication. Living a healthy lifestyle can help lower high blood pressure. You may need to change some of your habits. Lifestyle changes may include:  Following the DASH diet. This diet is high in fruits, vegetables, and whole grains. It is low in salt, red meat, and added sugars.  Getting at least 2 1/2 hours of brisk physical activity every week.  Losing weight if necessary.  Not smoking.  Limiting alcoholic beverages.  Learning ways to reduce stress. If lifestyle changes are not enough to get your blood pressure under control, your health care provider may prescribe medicine. You may need to take more than one. Work closely with your health care provider to understand the risks and benefits. HOME CARE INSTRUCTIONS  Have your blood pressure rechecked as directed by your health care provider.   Only take medicine as directed by your health care provider. Follow the directions carefully. Blood pressure medicines must be taken as prescribed. The medicine does not work as well when you skip doses. Skipping doses also puts you at risk for problems.   Do not smoke.   Monitor your blood pressure at home as directed by your health care provider. SEEK MEDICAL CARE IF:   You think you are having a reaction to medicines taken.  You have recurrent headaches or  feel dizzy.  You have swelling in your ankles.  You have trouble with your vision. SEEK IMMEDIATE MEDICAL CARE IF:  You develop a severe headache or confusion.  You have unusual weakness, numbness, or feel faint.  You have severe chest or abdominal pain.  You vomit repeatedly.  You have trouble breathing. MAKE  SURE YOU:   Understand these instructions.  Will watch your condition.  Will get help right away if you are not doing well or get worse. Document Released: 07/16/2005 Document Revised: 07/21/2013 Document Reviewed: 05/08/2013 [facility] Patient Information 2015 [place] PortExitCare, MarylandLLC. This information is not intended to replace advice given to you by your health care provider. Make sure you discuss any questions you have with your health care provider.

## 2014-01-20 NOTE — ED Notes (Signed)
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  During their evaluation, bp was noted to be 148/112.  Patient has come to [facility] for evaluation of blood pressure and to provide clearance for planned exercise program

## 2014-01-20 NOTE — ED Provider Notes (Signed)
Jose Schultz is a 48 y.o. male who presents to Urgent Care today for hypertension. Patient is currently undergoing physical therapy was found to have elevated blood pressure. He denies any chest pains palpitations or shortness of breath. He feels well otherwise. He is here today for evaluation. No syncope or headache.   Past Medical History  Diagnosis Date  . Muscle spasm of back     takes Flexeril as needed  . Weakness     numbness and tingling both feet  . Chronic back pain     spondylolisthesis and radiculopathy  . Depression     hx of-per pt no meds now   History  Substance Use Topics  . Smoking status: Current Every Day Smoker -- 0.50 packs/day for 32 years    Types: Cigarettes  . Smokeless tobacco: Not on file  . Alcohol Use: No   ROS as above Medications: No current facility-administered medications for this encounter.   Current Outpatient Prescriptions  Medication Sig Dispense Refill  . cyclobenzaprine (FLEXERIL) 10 MG tablet Take 1 tablet (10 mg total) by mouth 2 (two) times daily as needed for muscle spasms.  20 tablet  0  . diazepam (VALIUM) 5 MG tablet Take 1 tablet (5 mg total) by mouth every 6 (six) hours as needed for muscle spasms.  60 tablet  0  . hydrochlorothiazide (HYDRODIURIL) 12.5 MG tablet Take 1 tablet (12.5 mg total) by mouth daily.  30 tablet  1  . HYDROcodone-acetaminophen (NORCO/VICODIN) 5-325 MG per tablet Take 1 tablet by mouth every 6 (six) hours as needed for moderate pain.      . naproxen (NAPROSYN) 375 MG tablet Take 375 mg by mouth 2 (two) times daily with a meal.      . oxyCODONE (OXY IR/ROXICODONE) 5 MG immediate release tablet Take 1-2 tablets (5-10 mg total) by mouth every 4 (four) hours as needed for severe pain.  60 tablet  0    Exam:  BP 130/94  Pulse 71  Temp(Src) 97.7 F (36.5 C) (Oral)  Resp 18  SpO2 99% Gen: Well NAD HEENT: EOMI,  MMM Lungs: Normal work of breathing. CTABL Heart: RRR no MRG Abd: NABS, Soft. NT, ND Exts:  Brisk capillary refill, warm and well perfused.   Results for orders placed during the hospital encounter of 01/20/14 (from the past 24 hour(s))  POCT I-STAT, CHEM 8     Status: Abnormal   Collection Time    01/20/14  2:41 PM      Result Value Ref Range   Sodium 143  137 - 147 mEq/L   Potassium 4.4  3.7 - 5.3 mEq/L   Chloride 105  96 - 112 mEq/L   BUN 9  6 - 23 mg/dL   Creatinine, Ser 9.201.30  0.50 - 1.35 mg/dL   Glucose, Bld 93  70 - 99 mg/dL   Calcium, Ion 1.001.26 (*) 1.12 - 1.23 mmol/L   TCO2 26  0 - 100 mmol/L   Hemoglobin 16.7  13.0 - 17.0 g/dL   HCT 71.249.0  19.739.0 - 58.852.0 %   No results found.  Assessment and Plan: 48 y.o. male with hypertension. Plan to start low-dose hydrochlorothiazide. Followup with primary care provider.  Discussed warning signs or symptoms. Please see discharge instructions. Patient expresses understanding.    Rodolph BongEvan S Corey, MD 01/20/14 1535

## 2014-02-17 ENCOUNTER — Telehealth (HOSPITAL_COMMUNITY): Payer: Self-pay | Admitting: Family Medicine

## 2014-02-17 NOTE — ED Notes (Signed)
Filled functional capacity permission form for [facility].  He continues to feel well.   Rodolph BongEvan S Janiqua Friscia, MD 02/17/14 [phone]

## 2014-02-18 NOTE — ED Notes (Signed)
[place] concerning the paper work that was filled out for patient.  Inform ms jackie dr. Denyse Amassorey hours and she agreed to call when he is here

## 2014-02-25 ENCOUNTER — Ambulatory Visit: Payer: Self-pay | Admitting: Family Medicine

## 2014-02-26 ENCOUNTER — Telehealth: Payer: Self-pay | Admitting: Internal Medicine

## 2014-02-26 NOTE — Telephone Encounter (Signed)
Left voicemail for patient to call to schedule appointment to establish care. °

## 2014-03-15 NOTE — Progress Notes (Signed)
Suhaan Perleberg, OTR/L [phone]  

## 2014-04-08 ENCOUNTER — Ambulatory Visit: Payer: Self-pay | Admitting: Internal Medicine

## 2014-04-13 ENCOUNTER — Ambulatory Visit: Payer: Self-pay | Admitting: Family Medicine

## 2014-04-17 ENCOUNTER — Emergency Department (INDEPENDENT_AMBULATORY_CARE_PROVIDER_SITE_OTHER)
Admission: EM | Admit: 2014-04-17 | Discharge: 2014-04-17 | Disposition: A | Discharge status: Home / Self Care | Payer: Worker's Compensation | Source: Home / Self Care | Attending: Family Medicine | Admitting: Family Medicine

## 2014-04-17 ENCOUNTER — Encounter (HOSPITAL_COMMUNITY): Payer: Self-pay | Admitting: Emergency Medicine

## 2014-04-17 DIAGNOSIS — G894 Chronic pain syndrome: Secondary | ICD-10-CM

## 2014-04-17 MED ORDER — OXYCODONE HCL 10 MG PO TABS: 10.0000 mg | ORAL_TABLET | Freq: Four times a day (QID) | ORAL | Status: DC | PRN

## 2014-04-17 NOTE — ED Provider Notes (Signed)
CSN: [id number]     Arrival date & time 04/17/14  1413 History   First MD Initiated Contact with Patient 04/17/14 1428     Chief Complaint  Patient presents with  . Back Pain   (Consider location/radiation/quality/duration/timing/severity/associated sxs/prior Treatment) Patient is a 48 y.o. male presenting with back pain. The history is provided by the patient.  Back Pain Location:  Lumbar spine Quality:  Stabbing Pain severity:  Mild Chronicity:  Chronic Context comment:  Had 100 oxycod filled 03/03/2014 and 100hydrocod on 9/5 by dr Lovell Sheehan, states new meds upset stomach   Past Medical History  Diagnosis Date  . Muscle spasm of back     takes Flexeril as needed  . Weakness     numbness and tingling both feet  . Chronic back pain     spondylolisthesis and radiculopathy  . Depression     hx of-per pt no meds now  . Coronary artery disease    Past Surgical History  Procedure Laterality Date  . Right 5th finger surgery      in high school  . Back surgery    . Pacemaker insertion     History reviewed. No pertinent family history. History  Substance Use Topics  . Smoking status: Current Every Day Smoker -- 0.50 packs/day for 32 years    Types: Cigarettes  . Smokeless tobacco: Not on file  . Alcohol Use: No    Review of Systems  Constitutional: Negative.   Gastrointestinal: Positive for constipation. Negative for nausea and vomiting.  Musculoskeletal: Positive for back pain.    Allergies  Penicillins; Bee venom; and Other  Home Medications   Prior to Admission medications   Medication Sig Start Date End Date Taking? Authorizing Provider  cyclobenzaprine (FLEXERIL) 10 MG tablet Take 1 tablet (10 mg total) by mouth 2 (two) times daily as needed for muscle spasms. 09/05/12   Jimmye Norman, NP  diazepam (VALIUM) 5 MG tablet Take 1 tablet (5 mg total) by mouth every 6 (six) hours as needed for muscle spasms. 09/06/13   Barnett Abu, MD  hydrochlorothiazide (HYDRODIURIL)  12.5 MG tablet Take 1 tablet (12.5 mg total) by mouth daily. 01/20/14   Rodolph Bong, MD  HYDROcodone-acetaminophen (NORCO/VICODIN) 5-325 MG per tablet Take 1 tablet by mouth every 6 (six) hours as needed for moderate pain.    Historical Provider, MD  naproxen (NAPROSYN) 375 MG tablet Take 375 mg by mouth 2 (two) times daily with a meal.    Historical Provider, MD  oxyCODONE (OXY IR/ROXICODONE) 5 MG immediate release tablet Take 1-2 tablets (5-10 mg total) by mouth every 4 (four) hours as needed for severe pain. 09/06/13   Barnett Abu, MD  Oxycodone HCl 10 MG TABS Take 1 tablet (10 mg total) by mouth every 6 (six) hours as needed. 04/17/14   Linna Hoff, MD   BP 147/63  Pulse 104  Temp(Src) 98.5 F (36.9 C) (Oral)  Resp 18  SpO2 98% Physical Exam  Nursing note and vitals reviewed. Constitutional: He is oriented to person, place, and time. He appears well-developed and well-nourished. No distress.  Abdominal: Soft. Bowel sounds are normal. He exhibits no distension and no mass. There is no tenderness. There is no rebound and no guarding.  Musculoskeletal: He exhibits tenderness.  Healed lumbar scar, ambulatory without diff.  Neurological: He is alert and oriented to person, place, and time.  Skin: Skin is warm and dry.    ED Course  Procedures (including critical care  time) Labs Review Labs Reviewed - No data to display  Imaging Review No results found.   MDM   1. Chronic pain disorder        Linna Hoff, MD 04/17/14 [phone]

## 2014-04-17 NOTE — ED Notes (Addendum)
Pt  Reports  Back pain   And  Constipation      Pt  Reports       hydrocodone  Is  Hurting  His  Stomach  Last  bm  2  Days   Ago        Had   Surgery  approx 7  Months  Ago     Pt  Reports    He    Is  Requesting  A  rx  For   Oxycodone

## 2014-04-17 NOTE — Discharge Instructions (Signed)
Contact dr Lovell Sheehan on mon for further refills.

## 2014-04-17 NOTE — ED Notes (Addendum)
Wrong patient

## 2014-08-11 ENCOUNTER — Emergency Department (INDEPENDENT_AMBULATORY_CARE_PROVIDER_SITE_OTHER)
Admission: EM | Admit: 2014-08-11 | Discharge: 2014-08-11 | Disposition: A | Discharge status: Home / Self Care | Payer: Worker's Compensation | Source: Home / Self Care | Attending: Emergency Medicine | Admitting: Emergency Medicine

## 2014-08-11 ENCOUNTER — Encounter (HOSPITAL_COMMUNITY): Payer: Self-pay | Admitting: *Deleted

## 2014-08-11 DIAGNOSIS — M549 Dorsalgia, unspecified: Secondary | ICD-10-CM

## 2014-08-11 DIAGNOSIS — G8929 Other chronic pain: Secondary | ICD-10-CM

## 2014-08-11 DIAGNOSIS — I10 Essential (primary) hypertension: Secondary | ICD-10-CM | POA: Diagnosis not present

## 2014-08-11 LAB — POCT I-STAT, CHEM 8
BUN: 11 mg/dL (ref 6–23)
Calcium, Ion: 1.26 mmol/L — ABNORMAL HIGH (ref 1.12–1.23)
Chloride: 106 mEq/L (ref 96–112)
Creatinine, Ser: 1.2 mg/dL (ref 0.50–1.35)
Glucose, Bld: 101 mg/dL — ABNORMAL HIGH (ref 70–99)
HEMATOCRIT: 48 % (ref 39.0–52.0)
HEMOGLOBIN: 16.3 g/dL (ref 13.0–17.0)
Potassium: 4.1 mmol/L (ref 3.5–5.1)
SODIUM: 141 mmol/L (ref 135–145)
TCO2: 22 mmol/L (ref 0–100)

## 2014-08-11 MED ORDER — KETOROLAC TROMETHAMINE 60 MG/2ML IM SOLN
60.0000 mg | Freq: Once | INTRAMUSCULAR | Status: AC
  Administered 2014-08-11: 60 mg via INTRAMUSCULAR

## 2014-08-11 MED ORDER — LISINOPRIL-HYDROCHLOROTHIAZIDE 10-12.5 MG PO TABS: 1.0000 | ORAL_TABLET | Freq: Every day | ORAL | Status: DC

## 2014-08-11 MED ORDER — KETOROLAC TROMETHAMINE 60 MG/2ML IM SOLN
INTRAMUSCULAR | Status: AC
  Filled 2014-08-11: qty 2

## 2014-08-11 MED ORDER — IBUPROFEN 800 MG PO TABS: 800.0000 mg | ORAL_TABLET | Freq: Three times a day (TID) | ORAL | Status: DC

## 2014-08-11 NOTE — ED Provider Notes (Signed)
CSN: [id number]     Arrival date & time 08/11/14  [id number] History   First MD Initiated Contact with Patient 08/11/14 [phone]     Chief Complaint  Patient presents with  . Back Pain   (Consider location/radiation/quality/duration/timing/severity/associated sxs/prior Treatment) HPI       49 year old male presents requesting a refill of his pain medicine and to have his blood pressure medication adjusted. He takes 10 mg oxycodone chronically for back pain. He ran out but he will not be able to see his surgeon who did his back surgery for 3 more weeks. Denies any new pain. No extremity numbness or weakness, no loss of bowel or bladder control. No new injuries. He says that he wants to get the blood pressure medication adjusted because he very occasionally gets slightly dizzy, he attributes this to his blood pressure. He describes this as feeling lightheaded, slightly sweaty. This happens randomly, no alleviating or exacerbating factors. They resolve spontaneously within about 2 minutes. No associated chest pain or shortness of breath, no NVD. He is not dizzy at all right now. No history of blood sugar problems or diabetes   Past Medical History  Diagnosis Date  . Muscle spasm of back     takes Flexeril as needed  . Weakness     numbness and tingling both feet  . Chronic back pain     spondylolisthesis and radiculopathy  . Depression     hx of-per pt no meds now   Past Surgical History  Procedure Laterality Date  . Right 5th finger surgery      in high school  . Back surgery     History reviewed. No pertinent family history. History  Substance Use Topics  . Smoking status: Current Every Day Smoker -- 0.50 packs/day for 32 years    Types: Cigarettes  . Smokeless tobacco: Not on file  . Alcohol Use: No    Review of Systems  Musculoskeletal: Positive for back pain.  Neurological: Positive for dizziness.  All other systems reviewed and are negative.   Allergies  Penicillins; Bee venom;  and Other  Home Medications   Prior to Admission medications   Medication Sig Start Date End Date Taking? Authorizing Provider  cyclobenzaprine (FLEXERIL) 10 MG tablet Take 1 tablet (10 mg total) by mouth 2 (two) times daily as needed for muscle spasms. 09/05/12   Jimmye Normanavid [name], NP  diazepam (VALIUM) 5 MG tablet Take 1 tablet (5 mg total) by mouth every 6 (six) hours as needed for muscle spasms. 09/06/13   Barnett AbuHenry Elsner, MD  hydrochlorothiazide (HYDRODIURIL) 12.5 MG tablet Take 1 tablet (12.5 mg total) by mouth daily. 01/20/14   Rodolph BongEvan S Corey, MD  HYDROcodone-acetaminophen (NORCO/VICODIN) 5-325 MG per tablet Take 1 tablet by mouth every 6 (six) hours as needed for moderate pain.    Historical Provider, MD  ibuprofen (ADVIL,MOTRIN) 800 MG tablet Take 1 tablet (800 mg total) by mouth 3 (three) times daily. 08/11/14   Graylon GoodZachary H Emmerson Taddei, PA-C  lisinopril-hydrochlorothiazide (ZESTORETIC) 10-12.5 MG per tablet Take 1 tablet by mouth daily. 08/11/14   Graylon GoodZachary H Newel Oien, PA-C  naproxen (NAPROSYN) 375 MG tablet Take 375 mg by mouth 2 (two) times daily with a meal.    Historical Provider, MD  oxyCODONE (OXY IR/ROXICODONE) 5 MG immediate release tablet Take 1-2 tablets (5-10 mg total) by mouth every 4 (four) hours as needed for severe pain. 09/06/13   Barnett AbuHenry Elsner, MD  Oxycodone HCl 10 MG TABS Take 1 tablet (10  mg total) by mouth every 6 (six) hours as needed. 04/17/14   Linna Hoff, MD   BP 154/113 mmHg  Pulse 102  Temp(Src) 98.2 F (36.8 C) (Oral)  Resp 20  SpO2 100% Physical Exam  Constitutional: He is oriented to person, place, and time. He appears well-developed and well-nourished. He appears distressed.  HENT:  Head: Normocephalic.  Neck: No JVD present. No thyromegaly present.  Cardiovascular: Normal rate, regular rhythm, normal heart sounds and intact distal pulses.   Pulmonary/Chest: Effort normal and breath sounds normal. No respiratory distress.  Neurological: He is alert and oriented to person,  place, and time. Coordination normal.  Skin: Skin is warm and dry. No rash noted. He is not diaphoretic.  Psychiatric: He has a normal mood and affect. Judgment normal.  Nursing note and vitals reviewed.   ED Course  Procedures (including critical care time) Labs Review Labs Reviewed  POCT I-STAT, CHEM 8 - Abnormal; Notable for the following:    Glucose, Bld 101 (*)    Calcium, Ion 1.26 (*)    All other components within normal limits    Imaging Review No results found.   MDM   1. Back pain, chronic   2. Essential hypertension    Informed patient that we do not provide refills of narcotic pain medications. I offered him NSAIDs, will give an injection of Toradol and prescribed 800 mg ibuprofen. I will adjust his blood pressure medication to HCTZ/lisinopril and he will follow-up with his primary care provider.   Meds ordered this encounter  Medications  . lisinopril-hydrochlorothiazide (ZESTORETIC) 10-12.5 MG per tablet    Sig: Take 1 tablet by mouth daily.    Dispense:  30 tablet    Refill:  0    Order Specific Question:  Supervising Provider    Answer:  Lorenz Coaster, DAVID C [id number]  . ibuprofen (ADVIL,MOTRIN) 800 MG tablet    Sig: Take 1 tablet (800 mg total) by mouth 3 (three) times daily.    Dispense:  60 tablet    Refill:  0    Order Specific Question:  Supervising Provider    Answer:  Lorenz Coaster, DAVID C [id number]  . ketorolac (TORADOL) injection 60 mg    Sig:        Graylon Good, PA-C 08/11/14 1025

## 2014-08-11 NOTE — Discharge Instructions (Signed)
Chronic Back Pain ° When back pain lasts longer than 3 months, it is called chronic back pain. People with chronic back pain often go through certain periods that are more intense (flare-ups).  °CAUSES °Chronic back pain can be caused by wear and tear (degeneration) on different structures in your back. These structures include: °· The bones of your spine (vertebrae) and the joints surrounding your spinal cord and nerve roots (facets). °· The strong, fibrous tissues that connect your vertebrae (ligaments). °Degeneration of these structures may result in pressure on your nerves. This can lead to constant pain. °HOME CARE INSTRUCTIONS °· Avoid bending, heavy lifting, prolonged sitting, and activities which make the problem worse. °· Take brief periods of rest throughout the day to reduce your pain. Lying down or standing usually is better than sitting while you are resting. °· Take over-the-counter or prescription medicines only as directed by your caregiver. °SEEK IMMEDIATE MEDICAL CARE IF:  °· You have weakness or numbness in one of your legs or feet. °· You have trouble controlling your bladder or bowels. °· You have nausea, vomiting, abdominal pain, shortness of breath, or fainting. °Document Released: 08/23/2004 Document Revised: 10/08/2011 Document Reviewed: 06/30/2011 °ExitCare® Patient Information ©2015 [employer]. This information is not intended to replace advice given to you by your health care provider. Make sure you discuss any questions you have with your health care provider. ° °Hypertension °Hypertension, commonly called high blood pressure, is when the force of blood pumping through your arteries is too strong. Your arteries are the blood vessels that carry blood from your heart throughout your body. A blood pressure reading consists of a higher number over a lower number, such as 110/72. The higher number (systolic) is the pressure inside your arteries when your heart pumps. The lower number  (diastolic) is the pressure inside your arteries when your heart relaxes. Ideally you want your blood pressure below 120/80. °Hypertension forces your heart to work harder to pump blood. Your arteries may become narrow or stiff. Having hypertension puts you at risk for heart disease, stroke, and other problems.  °RISK FACTORS °Some risk factors for high blood pressure are controllable. Others are not.  °Risk factors you cannot control include:  °· Race. You may be at higher risk if you are African American. °· Age. Risk increases with age. °· Gender. Men are at higher risk than women before age 45 years. After age 65, women are at higher risk than men. °Risk factors you can control include: °· Not getting enough exercise or physical activity. °· Being overweight. °· Getting too much fat, sugar, calories, or salt in your diet. °· Drinking too much alcohol. °SIGNS AND SYMPTOMS °Hypertension does not usually cause signs or symptoms. Extremely high blood pressure (hypertensive crisis) may cause headache, anxiety, shortness of breath, and nosebleed. °DIAGNOSIS  °To check if you have hypertension, your health care provider will measure your blood pressure while you are seated, with your arm held at the level of your heart. It should be measured at least twice using the same arm. Certain conditions can cause a difference in blood pressure between your right and left arms. A blood pressure reading that is higher than normal on one occasion does not mean that you need treatment. If one blood pressure reading is high, ask your health care provider about having it checked again. °TREATMENT  °Treating high blood pressure includes making lifestyle changes and possibly taking medicine. Living a healthy lifestyle can help lower high blood pressure. You   may need to change some of your habits. °Lifestyle changes may include: °· Following the DASH diet. This diet is high in fruits, vegetables, and whole grains. It is low in salt, red  meat, and added sugars. °· Getting at least 2½ hours of brisk physical activity every week. °· Losing weight if necessary. °· Not smoking. °· Limiting alcoholic beverages. °· Learning ways to reduce stress. ° If lifestyle changes are not enough to get your blood pressure under control, your health care provider may prescribe medicine. You may need to take more than one. Work closely with your health care provider to understand the risks and benefits. °HOME CARE INSTRUCTIONS °· Have your blood pressure rechecked as directed by your health care provider.   °· Take medicines only as directed by your health care provider. Follow the directions carefully. Blood pressure medicines must be taken as prescribed. The medicine does not work as well when you skip doses. Skipping doses also puts you at risk for problems.   °· Do not smoke.   °· Monitor your blood pressure at home as directed by your health care provider.  °SEEK MEDICAL CARE IF:  °· You think you are having a reaction to medicines taken. °· You have recurrent headaches or feel dizzy. °· You have swelling in your ankles. °· You have trouble with your vision. °SEEK IMMEDIATE MEDICAL CARE IF: °· You develop a severe headache or confusion. °· You have unusual weakness, numbness, or feel faint. °· You have severe chest or abdominal pain. °· You vomit repeatedly. °· You have trouble breathing. °MAKE SURE YOU:  °· Understand these instructions. °· Will watch your condition. °· Will get help right away if you are not doing well or get worse. °Document Released: 07/16/2005 Document Revised: 11/30/2013 Document Reviewed: 05/08/2013 °ExitCare® Patient Information ©2015 [employer]. This information is not intended to replace advice given to you by your health care provider. Make sure you discuss any questions you have with your health care provider. ° °

## 2014-08-11 NOTE — ED Notes (Signed)
Pt  Reports  Chronic     Back pain         He is  Out  Of  His  Pain meds     He  Is    Sitting  Upright       On  Exam table         He  Reports  He  Is  Taking  hctz  For his  bp      He  Is  Speaking in  Complete  sentances

## 2014-08-13 ENCOUNTER — Encounter (HOSPITAL_COMMUNITY): Payer: Self-pay | Admitting: Emergency Medicine

## 2014-08-13 ENCOUNTER — Emergency Department (INDEPENDENT_AMBULATORY_CARE_PROVIDER_SITE_OTHER)
Admission: EM | Admit: 2014-08-13 | Discharge: 2014-08-13 | Disposition: A | Discharge status: Home / Self Care | Payer: Worker's Compensation | Source: Home / Self Care | Attending: Family Medicine | Admitting: Family Medicine

## 2014-08-13 DIAGNOSIS — M4316 Spondylolisthesis, lumbar region: Secondary | ICD-10-CM | POA: Diagnosis not present

## 2014-08-13 MED ORDER — OXYCODONE-ACETAMINOPHEN 10-325 MG PO TABS: 1.0000 | ORAL_TABLET | Freq: Two times a day (BID) | ORAL | Status: DC | PRN

## 2014-08-13 NOTE — Discharge Instructions (Signed)
Thank you for coming in today. Follow-up with Dr. Lovell SheehanJenkins as previously scheduled I'm sorry this happened but I'm happy to help out.  Come back as needed  Come back or go to the emergency room if you notice new weakness new numbness problems walking or bowel or bladder problems.

## 2014-08-13 NOTE — ED Provider Notes (Signed)
Jose Schultz is a 49 y.o. male who presents to Urgent Care today for sciatica. Patient has chronic sciatica which is currently managed by Dr. Lovell SheehanJenkins.  He was prescribed 100 tablets of oxycodone 10/325 in December. However due to a [employer] issue he was only dispensed 68 tablets. He ran out Wednesday and his pain is worsened. 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  His pain has worsened since he ran out of medicine. It radiates to the left leg. No new weakness or numbness.   Past Medical History  Diagnosis Date  . Muscle spasm of back     takes Flexeril as needed  . Weakness     numbness and tingling both feet  . Chronic back pain     spondylolisthesis and radiculopathy  . Depression     hx of-per pt no meds now   Past Surgical History  Procedure Laterality Date  . Right 5th finger surgery      in high school  . Back surgery     History  Substance Use Topics  . Smoking status: Current Every Day Smoker -- 0.50 packs/day for 32 years    Types: Cigarettes  . Smokeless tobacco: Not on file  . Alcohol Use: No   ROS as above Medications: No current facility-administered medications for this encounter.   Current Outpatient Prescriptions  Medication Sig Dispense Refill  . lisinopril-hydrochlorothiazide (ZESTORETIC) 10-12.5 MG per tablet Take 1 tablet by mouth daily. 30 tablet 0  . cyclobenzaprine (FLEXERIL) 10 MG tablet Take 1 tablet (10 mg total) by mouth 2 (two) times daily as needed for muscle spasms. 20 tablet 0  . diazepam (VALIUM) 5 MG tablet Take 1 tablet (5 mg total) by mouth every 6 (six) hours as needed for muscle spasms. 60 tablet 0  . hydrochlorothiazide (HYDRODIURIL) 12.5 MG tablet Take 1 tablet (12.5 mg total) by mouth daily. 30 tablet 1  . ibuprofen (ADVIL,MOTRIN) 800 MG tablet Take 1 tablet (800 mg total) by mouth 3 (three) times daily. 60 tablet 0  . naproxen (NAPROSYN) 375 MG tablet Take 375 mg by mouth 2 (two)  times daily with a meal.    . oxyCODONE-acetaminophen (PERCOCET) 10-325 MG per tablet Take 1 tablet by mouth 2 (two) times daily as needed for pain. 8 tablet 0   Allergies  Allergen Reactions  . Penicillins Diarrhea  . Bee Venom   . Other     [place]     Exam:  BP 138/90 mmHg  Pulse 109  Temp(Src) 97.6 F (36.4 C) (Oral)  Resp 20  SpO2 99% Gen: Well NAD in pain appearing Back: Nontender to midline tender palpation bilateral lumbar paraspinals. Lower extremity strength is intact. Antalgic gait present. Sensation intact  No results found for this or any previous visit (from the past 24 hour(s)). No results found.  Assessment and Plan: 49 y.o. male with sciatica. I discussed the case with his pharmacist. She corroborates his story. This is a medication dispensing issue. I called the on-call provider at Dr. Lovell SheehanJenkins office (Dr. Jeral FruitBotero) he agrees with the plan of dispensing 8 tablets of oxycodone until patient can be seen at his neurosurgery office on Monday. I have written a letter as well to his primary neurosurgeon to discuss the plan.  Discussed warning signs or symptoms. Please see discharge instructions. Patient expresses understanding.     Rodolph BongEvan S Corey, MD 08/13/14 [phone]

## 2014-08-13 NOTE — ED Notes (Signed)
C/o lower chronic back pain; seen here on 08/11/14 Reports he has been out of his Oxycodone onset Wednesday and wont get another Rx till Monday Alert, no signs of acute distress.

## 2014-09-02 ENCOUNTER — Emergency Department (INDEPENDENT_AMBULATORY_CARE_PROVIDER_SITE_OTHER)
Admission: EM | Admit: 2014-09-02 | Discharge: 2014-09-02 | Disposition: A | Discharge status: Home / Self Care | Payer: No Typology Code available for payment source | Source: Home / Self Care | Attending: Family Medicine | Admitting: Family Medicine

## 2014-09-02 ENCOUNTER — Encounter (HOSPITAL_COMMUNITY): Payer: Self-pay | Admitting: Emergency Medicine

## 2014-09-02 DIAGNOSIS — I10 Essential (primary) hypertension: Secondary | ICD-10-CM

## 2014-09-02 LAB — POCT I-STAT, CHEM 8
BUN: 14 mg/dL (ref 6–23)
CHLORIDE: 103 mmol/L (ref 96–112)
CREATININE: 1.3 mg/dL (ref 0.50–1.35)
Calcium, Ion: 1.22 mmol/L (ref 1.12–1.23)
Glucose, Bld: 91 mg/dL (ref 70–99)
HEMATOCRIT: 46 % (ref 39.0–52.0)
HEMOGLOBIN: 15.6 g/dL (ref 13.0–17.0)
POTASSIUM: 3.7 mmol/L (ref 3.5–5.1)
Sodium: 141 mmol/L (ref 135–145)
TCO2: 24 mmol/L (ref 0–100)

## 2014-09-02 MED ORDER — LISINOPRIL-HYDROCHLOROTHIAZIDE 20-12.5 MG PO TABS: 1.0000 | ORAL_TABLET | Freq: Every day | ORAL | Status: DC

## 2014-09-02 NOTE — Discharge Instructions (Signed)
Thank you for coming in today. Start taking new blood pressure medication tomorrow. Follow-up with primary care provider. Call or go to the emergency room if you get worse, have trouble breathing, have chest pains, or palpitations.    Hypertension Hypertension, commonly called high blood pressure, is when the force of blood pumping through your arteries is too strong. Your arteries are the blood vessels that carry blood from your heart throughout your body. A blood pressure reading consists of a higher number over a lower number, such as 110/72. The higher number (systolic) is the pressure inside your arteries when your heart pumps. The lower number (diastolic) is the pressure inside your arteries when your heart relaxes. Ideally you want your blood pressure below 120/80. Hypertension forces your heart to work harder to pump blood. Your arteries may become narrow or stiff. Having hypertension puts you at risk for heart disease, stroke, and other problems.  RISK FACTORS Some risk factors for high blood pressure are controllable. Others are not.  Risk factors you cannot control include:   Race. You may be at higher risk if you are African American.  Age. Risk increases with age.  Gender. Men are at higher risk than women before age 43 years. After age 80, women are at higher risk than men. Risk factors you can control include:  Not getting enough exercise or physical activity.  Being overweight.  Getting too much fat, sugar, calories, or salt in your diet.  Drinking too much alcohol. SIGNS AND SYMPTOMS Hypertension does not usually cause signs or symptoms. Extremely high blood pressure (hypertensive crisis) may cause headache, anxiety, shortness of breath, and nosebleed. DIAGNOSIS  To check if you have hypertension, your health care provider will measure your blood pressure while you are seated, with your arm held at the level of your heart. It should be measured at least twice using the  same arm. Certain conditions can cause a difference in blood pressure between your right and left arms. A blood pressure reading that is higher than normal on one occasion does not mean that you need treatment. If one blood pressure reading is high, ask your health care provider about having it checked again. TREATMENT  Treating high blood pressure includes making lifestyle changes and possibly taking medicine. Living a healthy lifestyle can help lower high blood pressure. You may need to change some of your habits. Lifestyle changes may include:  Following the DASH diet. This diet is high in fruits, vegetables, and whole grains. It is low in salt, red meat, and added sugars.  Getting at least 2 hours of brisk physical activity every week.  Losing weight if necessary.  Not smoking.  Limiting alcoholic beverages.  Learning ways to reduce stress. If lifestyle changes are not enough to get your blood pressure under control, your health care provider may prescribe medicine. You may need to take more than one. Work closely with your health care provider to understand the risks and benefits. HOME CARE INSTRUCTIONS  Have your blood pressure rechecked as directed by your health care provider.   Take medicines only as directed by your health care provider. Follow the directions carefully. Blood pressure medicines must be taken as prescribed. The medicine does not work as well when you skip doses. Skipping doses also puts you at risk for problems.   Do not smoke.   Monitor your blood pressure at home as directed by your health care provider. SEEK MEDICAL CARE IF:   You think you are having  a reaction to medicines taken.  You have recurrent headaches or feel dizzy.  You have swelling in your ankles.  You have trouble with your vision. SEEK IMMEDIATE MEDICAL CARE IF:  You develop a severe headache or confusion.  You have unusual weakness, numbness, or feel faint.  You have severe  chest or abdominal pain.  You vomit repeatedly.  You have trouble breathing. MAKE SURE YOU:   Understand these instructions.  Will watch your condition.  Will get help right away if you are not doing well or get worse. Document Released: 07/16/2005 Document Revised: 11/30/2013 Document Reviewed: 05/08/2013 [facility] Patient Information 2015 [place]. This information is not intended to replace advice given to you by your health care provider. Make sure you discuss any questions you have with your health care provider.

## 2014-09-02 NOTE — ED Notes (Signed)
Pt here for medication refill / possible change in meds.   Voices no other concerns.

## 2014-09-02 NOTE — ED Provider Notes (Signed)
Jose Schultz is a 49 y.o. male who presents to Urgent Care today for hypertension. Patient was prescribed lisinopril hydrochlorothiazide on January 13. He is about to run out. His physician appointment with [facility] is in mid February. He would like a refill if possible. He notes that his blood pressures been mildly elevated despite the antibiotics. No chest pain or palpitations or shortness of breath.   Past Medical History  Diagnosis Date  . Muscle spasm of back     takes Flexeril as needed  . Weakness     numbness and tingling both feet  . Chronic back pain     spondylolisthesis and radiculopathy  . Depression     hx of-per pt no meds now   Past Surgical History  Procedure Laterality Date  . Right 5th finger surgery      in high school  . Back surgery     History  Substance Use Topics  . Smoking status: Current Every Day Smoker -- 0.50 packs/day for 32 years    Types: Cigarettes  . Smokeless tobacco: Not on file  . Alcohol Use: No   ROS as above Medications: No current facility-administered medications for this encounter.   Current Outpatient Prescriptions  Medication Sig Dispense Refill  . cyclobenzaprine (FLEXERIL) 10 MG tablet Take 1 tablet (10 mg total) by mouth 2 (two) times daily as needed for muscle spasms. 20 tablet 0  . diazepam (VALIUM) 5 MG tablet Take 1 tablet (5 mg total) by mouth every 6 (six) hours as needed for muscle spasms. 60 tablet 0  . ibuprofen (ADVIL,MOTRIN) 800 MG tablet Take 1 tablet (800 mg total) by mouth 3 (three) times daily. 60 tablet 0  . lisinopril-hydrochlorothiazide (PRINZIDE) 20-12.5 MG per tablet Take 1 tablet by mouth daily. 30 tablet 0  . naproxen (NAPROSYN) 375 MG tablet Take 375 mg by mouth 2 (two) times daily with a meal.    . oxyCODONE-acetaminophen (PERCOCET) 10-325 MG per tablet Take 1 tablet by mouth 2 (two) times daily as needed for pain. 8 tablet 0  . [DISCONTINUED] hydrochlorothiazide (HYDRODIURIL) 12.5 MG tablet Take  1 tablet (12.5 mg total) by mouth daily. 30 tablet 1   Allergies  Allergen Reactions  . Penicillins Diarrhea  . Bee Venom   . Other     [place]     Exam:  BP 141/97 mmHg  Pulse 75  Temp(Src) 98.1 F (36.7 C) (Oral)  Resp 18  SpO2 100% Gen: Well NAD HEENT: EOMI,  MMM Lungs: Normal work of breathing. CTABL Heart: RRR no MRG Abd: NABS, Soft. Nondistended, Nontender Exts: Brisk capillary refill, warm and well perfused.   Results for orders placed or performed during the hospital encounter of 09/02/14 (from the past 24 hour(s))  I-STAT, chem 8     Status: None   Collection Time: 09/02/14  8:30 PM  Result Value Ref Range   Sodium 141 135 - 145 mmol/L   Potassium 3.7 3.5 - 5.1 mmol/L   Chloride 103 96 - 112 mmol/L   BUN 14 6 - 23 mg/dL   Creatinine, Ser 1.611.30 0.50 - 1.35 mg/dL   Glucose, Bld 91 70 - 99 mg/dL   Calcium, Ion 0.961.22 0.451.12 - 1.23 mmol/L   TCO2 24 0 - 100 mmol/L   Hemoglobin 15.6 13.0 - 17.0 g/dL   HCT 40.946.0 81.139.0 - 91.452.0 %   No results found.  Assessment and Plan: 49 y.o. male with hypertension. Increased doses of hydrochlorothiazide and lisinopril. Follow-up  with PCP as directed.  Discussed warning signs or symptoms. Please see discharge instructions. Patient expresses understanding.     Rodolph Bong, MD 09/02/14 2049

## 2014-10-08 ENCOUNTER — Encounter (HOSPITAL_COMMUNITY): Payer: Self-pay | Admitting: Emergency Medicine

## 2014-10-08 ENCOUNTER — Emergency Department (HOSPITAL_COMMUNITY)
Admission: EM | Admit: 2014-10-08 | Discharge: 2014-10-09 | Disposition: A | Discharge status: Home / Self Care | Payer: No Typology Code available for payment source | Attending: Emergency Medicine | Admitting: Emergency Medicine

## 2014-10-08 ENCOUNTER — Emergency Department (HOSPITAL_COMMUNITY): Payer: No Typology Code available for payment source

## 2014-10-08 DIAGNOSIS — J189 Pneumonia, unspecified organism: Secondary | ICD-10-CM

## 2014-10-08 DIAGNOSIS — R05 Cough: Secondary | ICD-10-CM

## 2014-10-08 DIAGNOSIS — Z8659 Personal history of other mental and behavioral disorders: Secondary | ICD-10-CM | POA: Insufficient documentation

## 2014-10-08 DIAGNOSIS — Z791 Long term (current) use of non-steroidal anti-inflammatories (NSAID): Secondary | ICD-10-CM | POA: Insufficient documentation

## 2014-10-08 DIAGNOSIS — G8929 Other chronic pain: Secondary | ICD-10-CM | POA: Insufficient documentation

## 2014-10-08 DIAGNOSIS — Z79899 Other long term (current) drug therapy: Secondary | ICD-10-CM | POA: Insufficient documentation

## 2014-10-08 DIAGNOSIS — R000 Tachycardia, unspecified: Secondary | ICD-10-CM | POA: Diagnosis not present

## 2014-10-08 DIAGNOSIS — Z880 Allergy status to penicillin: Secondary | ICD-10-CM | POA: Diagnosis not present

## 2014-10-08 DIAGNOSIS — Z720 Tobacco use: Secondary | ICD-10-CM | POA: Diagnosis not present

## 2014-10-08 DIAGNOSIS — R110 Nausea: Secondary | ICD-10-CM | POA: Diagnosis not present

## 2014-10-08 DIAGNOSIS — R197 Diarrhea, unspecified: Secondary | ICD-10-CM | POA: Diagnosis not present

## 2014-10-08 DIAGNOSIS — J159 Unspecified bacterial pneumonia: Secondary | ICD-10-CM | POA: Diagnosis not present

## 2014-10-08 DIAGNOSIS — R059 Cough, unspecified: Secondary | ICD-10-CM

## 2014-10-08 LAB — I-STAT CHEM 8, ED
BUN: 12 mg/dL (ref 6–23)
CREATININE: 1.4 mg/dL — AB (ref 0.50–1.35)
Calcium, Ion: 1.14 mmol/L (ref 1.12–1.23)
Chloride: 100 mmol/L (ref 96–112)
Glucose, Bld: 125 mg/dL — ABNORMAL HIGH (ref 70–99)
HEMATOCRIT: 45 % (ref 39.0–52.0)
HEMOGLOBIN: 15.3 g/dL (ref 13.0–17.0)
POTASSIUM: 3.3 mmol/L — AB (ref 3.5–5.1)
SODIUM: 138 mmol/L (ref 135–145)
TCO2: 18 mmol/L (ref 0–100)

## 2014-10-08 LAB — CBC WITH DIFFERENTIAL/PLATELET
BASOS ABS: 0.1 10*3/uL (ref 0.0–0.1)
BASOS PCT: 1 % (ref 0–1)
Eosinophils Absolute: 0.1 10*3/uL (ref 0.0–0.7)
Eosinophils Relative: 1 % (ref 0–5)
HCT: 40.9 % (ref 39.0–52.0)
Hemoglobin: 14.5 g/dL (ref 13.0–17.0)
Lymphocytes Relative: 9 % — ABNORMAL LOW (ref 12–46)
Lymphs Abs: 0.5 10*3/uL — ABNORMAL LOW (ref 0.7–4.0)
MCH: 30.3 pg (ref 26.0–34.0)
MCHC: 35.5 g/dL (ref 30.0–36.0)
MCV: 85.4 fL (ref 78.0–100.0)
Monocytes Absolute: 0.6 10*3/uL (ref 0.1–1.0)
Monocytes Relative: 11 % (ref 3–12)
NEUTROS PCT: 78 % — AB (ref 43–77)
Neutro Abs: 4.1 10*3/uL (ref 1.7–7.7)
Platelets: 144 10*3/uL — ABNORMAL LOW (ref 150–400)
RBC: 4.79 MIL/uL (ref 4.22–5.81)
RDW: 12.6 % (ref 11.5–15.5)
WBC: 5.3 10*3/uL (ref 4.0–10.5)

## 2014-10-08 LAB — RAPID STREP SCREEN (MED CTR MEBANE ONLY): STREPTOCOCCUS, GROUP A SCREEN (DIRECT): NEGATIVE

## 2014-10-08 MED ORDER — ALUM & MAG HYDROXIDE-SIMETH 200-200-20 MG/5ML PO SUSP
15.0000 mL | Freq: Once | ORAL | Status: AC
  Administered 2014-10-09: 15 mL via ORAL
  Filled 2014-10-08: qty 30

## 2014-10-08 MED ORDER — SODIUM CHLORIDE 0.9 % IV BOLUS (SEPSIS)
1000.0000 mL | Freq: Once | INTRAVENOUS | Status: AC
  Administered 2014-10-08: 1000 mL via INTRAVENOUS

## 2014-10-08 MED ORDER — AZITHROMYCIN 250 MG PO TABS: ORAL_TABLET | ORAL | Status: DC

## 2014-10-08 MED ORDER — SODIUM CHLORIDE 0.9 % IV BOLUS (SEPSIS)
1000.0000 mL | Freq: Once | INTRAVENOUS | Status: AC
Start: 2014-10-08 — End: 2014-10-09
  Administered 2014-10-08: 1000 mL via INTRAVENOUS

## 2014-10-08 NOTE — ED Notes (Addendum)
Pt c/o diarrhea onset yesterday, denies nausea or vomiting.  Also c/o sore throat with productive cough since yesterday.

## 2014-10-08 NOTE — ED Provider Notes (Signed)
I saw and evaluated the patient, reviewed the resident's note and I agree with the findings and plan.   EKG Interpretation None      49 yo male with complaints of malaise, diarrhea, chills, cough.  On exam, well appearing, nontoxic, not distressed, normal respiratory effort, normal perfusion, abdomen soft with minimal lower abdominal tenderness.  Labs unremarkable. CXR showed questionable pneumonia.  Given constellation of symptoms, will treat as CAP. He appears to stable for outpatient treatment.    Clinical Impression: 1. Diarrhea   2. Cough   3. Community acquired pneumonia       Blake DivineJohn Davionna Blacksher, MD 10/09/14 1610

## 2014-10-08 NOTE — ED Provider Notes (Signed)
CSN: [id number]     Arrival date & time 10/08/14  1858 History   First MD Initiated Contact with Patient 10/08/14 2205     Chief Complaint  Patient presents with  . Diarrhea     (Consider location/radiation/quality/duration/timing/severity/associated sxs/prior Treatment) HPI   Patient with no sig past medical history comes in today with 4 episodes of non-bloody diarrhea associated with nausea since yesterday. He has also been having a cough which has been mildly productive.  He's also been having generalized malaise and fevers chills since Darral Dashsther is well no chest pain shortness breath and dysuria hematuria no increased urinary frequency.  Past Medical History  Diagnosis Date  . Muscle spasm of back     takes Flexeril as needed  . Weakness     numbness and tingling both feet  . Chronic back pain     spondylolisthesis and radiculopathy  . Depression     hx of-per pt no meds now   Past Surgical History  Procedure Laterality Date  . Right 5th finger surgery      in high school  . Back surgery     No family history on file. History  Substance Use Topics  . Smoking status: Current Every Day Smoker -- 0.50 packs/day for 32 years    Types: Cigarettes  . Smokeless tobacco: Not on file  . Alcohol Use: No    Review of Systems  Constitutional: Negative for fever and chills.  Eyes: Negative for redness.  Respiratory: Negative for cough and shortness of breath.   Cardiovascular: Negative for chest pain.  Gastrointestinal: Positive for nausea and diarrhea. Negative for vomiting and abdominal pain.  Genitourinary: Negative for dysuria.  Skin: Negative for rash.  Neurological: Negative for headaches.      Allergies  Penicillins; Bee venom; and Other  Home Medications   Prior to Admission medications   Medication Sig Start Date End Date Taking? Authorizing Provider  acetaminophen (TYLENOL) 500 MG tablet Take 1,000 mg by mouth every 6 (six) hours as needed for moderate pain.    Yes Historical Provider, MD  ibuprofen (ADVIL,MOTRIN) 800 MG tablet Take 1 tablet (800 mg total) by mouth 3 (three) times daily. 08/11/14  Yes Graylon GoodZachary H Baker, PA-C  lisinopril-hydrochlorothiazide (PRINZIDE) 20-12.5 MG per tablet Take 1 tablet by mouth daily. 09/02/14  Yes Rodolph BongEvan S Corey, MD  oxyCODONE-acetaminophen (PERCOCET) 10-325 MG per tablet Take 1 tablet by mouth 2 (two) times daily as needed for pain. 08/13/14  Yes Rodolph BongEvan S Corey, MD  cyclobenzaprine (FLEXERIL) 10 MG tablet Take 1 tablet (10 mg total) by mouth 2 (two) times daily as needed for muscle spasms. 09/05/12   Felicie Mornavid Smith, NP  diazepam (VALIUM) 5 MG tablet Take 1 tablet (5 mg total) by mouth every 6 (six) hours as needed for muscle spasms. 09/06/13   Barnett AbuHenry Elsner, MD  naproxen (NAPROSYN) 375 MG tablet Take 375 mg by mouth 2 (two) times daily with a meal.    Historical Provider, MD   BP 117/92 mmHg  Pulse 103  Temp(Src) 99.1 F (37.3 C) (Oral)  Resp 20  SpO2 99% Physical Exam  Constitutional: No distress.  HENT:  Head: Normocephalic and atraumatic.  Eyes: EOM are normal. Pupils are equal, round, and reactive to light.  Neck: Normal range of motion. Neck supple.  Cardiovascular:  tachy  Pulmonary/Chest: Effort normal. No respiratory distress.  Abdominal: Soft. There is no tenderness (  ). There is no rebound and no guarding.  Skin: No rash noted.  He is not diaphoretic.    ED Course  Procedures (including critical care time) Labs Review Labs Reviewed  CBC WITH DIFFERENTIAL/PLATELET - Abnormal; Notable for the following:    Platelets 144 (*)    Neutrophils Relative % 78 (*)    Lymphocytes Relative 9 (*)    Lymphs Abs 0.5 (*)    All other components within normal limits  I-STAT CHEM 8, ED - Abnormal; Notable for the following:    Potassium 3.3 (*)    Creatinine, Ser 1.40 (*)    Glucose, Bld 125 (*)    All other components within normal limits  RAPID STREP SCREEN  CULTURE, GROUP A STREP    Imaging Review Dg Chest 2  View  10/08/2014   CLINICAL DATA:  Fever, shortness of breath, sore throat, cough and diarrhea for 2 days, ongoing low back pain since February 2014, hypertension, smoking  EXAM: CHEST  2 VIEW  COMPARISON:  The 09/03/2013  FINDINGS: Normal heart size, mediastinal contours, and pulmonary vascularity.  Question vague nodular density lateral mid LEFT lung.  Minimal peribronchial thickening.  Mild atelectasis or infiltrate in RIGHT lower lobe.  Remaining lungs clear.  No pleural effusion or pneumothorax.  No acute osseous findings.  IMPRESSION: Minimal bronchitic changes with atelectasis sec ptosis or infiltrate in RIGHT lower lobe.  Question vague nodular density LEFT mid lung; CT chest recommended to exclude pulmonary nodule.   Electronically Signed   By: Ulyses Southward M.D.   On: 10/08/2014 19:41     EKG Interpretation None      MDM   Final diagnoses:  None    This patient has no sig PMH and presents with nausea diarrhea since yesterday associated with fevers chills and cough. Cough has been minimally productive.  Exam as above he is as a temp of 99.1 is tachycardic initially 127 while he was in the waiting room he had labs obtained which included CBC simply both of which were unremarkable with creatinine 1.4 which is at his baseline. CBC without leukocytosis. Chest x-ray was obtained showed possible infiltrate versus atelectasis given the productive cough we'll treat this as pneumonia.   Once again back to his bed his heart rate was better rate around 100, have given NS bolus  He is very well-appearing on exam  he does not appear septic feel that he is safe for discharge after fluid resuscitation with outpatient management of this pneumonia.  I have discussed the results, Dx and Tx plan with the patient. They expressed understanding and agree with the plan and were told to return to ED with any worsening of condition or concern.    Disposition: Discharge  Condition: Good  New Prescriptions    AZITHROMYCIN (ZITHROMAX Z-PAK) 250 MG TABLET    Take 2 tabs the first day and then 1 tab daily through day 5    Follow Up: Doris Cheadle, MD [address] [place] [address] [phone]  In 3 days    Pt seen in conjunction with Dr. Daphene Calamity, MD 10/09/14 9147  Blake Divine, MD 10/09/14 [phone]

## 2014-10-08 NOTE — Discharge Instructions (Signed)

## 2014-10-08 NOTE — ED Notes (Signed)
Called x2 for triage with no response

## 2014-10-11 LAB — CULTURE, GROUP A STREP: STREP A CULTURE: NEGATIVE

## 2014-10-14 ENCOUNTER — Encounter (HOSPITAL_COMMUNITY): Payer: Self-pay | Admitting: *Deleted

## 2014-10-14 ENCOUNTER — Emergency Department (INDEPENDENT_AMBULATORY_CARE_PROVIDER_SITE_OTHER)
Admission: EM | Admit: 2014-10-14 | Discharge: 2014-10-14 | Disposition: A | Discharge status: Home / Self Care | Payer: No Typology Code available for payment source | Source: Home / Self Care | Attending: Family Medicine | Admitting: Family Medicine

## 2014-10-14 DIAGNOSIS — J189 Pneumonia, unspecified organism: Secondary | ICD-10-CM

## 2014-10-14 MED ORDER — EPINEPHRINE 0.3 MG/0.3ML IJ SOAJ: 0.3000 mg | Freq: Once | INTRAMUSCULAR | Status: DC

## 2014-10-14 NOTE — Discharge Instructions (Signed)
As we discussed, please follow up with your primary care doctor to arrange your having a cat scan of your chest to evaluate the left mid lung density described on your chest xray from 10/09/2014 Pneumonia Pneumonia is an infection of the lungs.  CAUSES Pneumonia may be caused by bacteria or a virus. Usually, these infections are caused by breathing infectious particles into the lungs (respiratory tract). SIGNS AND SYMPTOMS   Cough.  Fever.  Chest pain.  Increased rate of breathing.  Wheezing.  Mucus production. DIAGNOSIS  If you have the common symptoms of pneumonia, your health care provider will typically confirm the diagnosis with a chest X-ray. The X-ray will show an abnormality in the lung (pulmonary infiltrate) if you have pneumonia. Other tests of your blood, urine, or sputum may be done to find the specific cause of your pneumonia. Your health care provider may also do tests (blood gases or pulse oximetry) to see how well your lungs are working. TREATMENT  Some forms of pneumonia may be spread to other people when you cough or sneeze. You may be asked to wear a mask before and during your exam. Pneumonia that is caused by bacteria is treated with antibiotic medicine. Pneumonia that is caused by the influenza virus may be treated with an antiviral medicine. Most other viral infections must run their course. These infections will not respond to antibiotics.  HOME CARE INSTRUCTIONS   Cough suppressants may be used if you are losing too much rest. However, coughing protects you by clearing your lungs. You should avoid using cough suppressants if you can.  Your health care provider may have prescribed medicine if he or she thinks your pneumonia is caused by bacteria or influenza. Finish your medicine even if you start to feel better.  Your health care provider may also prescribe an expectorant. This loosens the mucus to be coughed up.  Take medicines only as directed by your health  care provider.  Do not smoke. Smoking is a common cause of bronchitis and can contribute to pneumonia. If you are a smoker and continue to smoke, your cough may last several weeks after your pneumonia has cleared.  A cold steam vaporizer or humidifier in your room or home may help loosen mucus.  Coughing is often worse at night. Sleeping in a semi-upright position in a recliner or using a couple pillows under your head will help with this.  Get rest as you feel it is needed. Your body will usually let you know when you need to rest. PREVENTION A pneumococcal shot (vaccine) is available to prevent a common bacterial cause of pneumonia. This is usually suggested for:  People over 47 years old.  Patients on chemotherapy.  People with chronic lung problems, such as bronchitis or emphysema.  People with immune system problems. If you are over 65 or have a high risk condition, you may receive the pneumococcal vaccine if you have not received it before. In some countries, a routine influenza vaccine is also recommended. This vaccine can help prevent some cases of pneumonia.You may be offered the influenza vaccine as part of your care. If you smoke, it is time to quit. You may receive instructions on how to stop smoking. Your health care provider can provide medicines and counseling to help you quit. SEEK MEDICAL CARE IF: You have a fever. SEEK IMMEDIATE MEDICAL CARE IF:   Your illness becomes worse. This is especially true if you are elderly or weakened from any other disease.  You cannot control your cough with suppressants and are losing sleep.  You begin coughing up blood.  You develop pain which is getting worse or is uncontrolled with medicines.  Any of the symptoms which initially brought you in for treatment are getting worse rather than better.  You develop shortness of breath or chest pain. MAKE SURE YOU:   Understand these instructions.  Will watch your condition.  Will  get help right away if you are not doing well or get worse. Document Released: 07/16/2005 Document Revised: 11/30/2013 Document Reviewed: 10/05/2010 [facility] Patient Information 2015 [place], [place]. This information is not intended to replace advice given to you by your health care provider. Make sure you discuss any questions you have with your health care provider.

## 2014-10-14 NOTE — ED Provider Notes (Signed)
CSN: [id number]     Arrival date & time 10/14/14  0841 History   First MD Initiated Contact with Patient 10/14/14 [phone]     Chief Complaint  Patient presents with  . Follow-up   (Consider location/radiation/quality/duration/timing/severity/associated sxs/prior Treatment) HPI Comments: States he was dignosed and treated for CAP beginning 10/08/2013 and recently finished his abx. States he was instructed to follow up with his PCP, however, he has not been able to get an appointment. So he is here to follow up. States he is feeling much better.  Smoker Unemployed/SSDI  Patient also states his Epipen has expired and he requests refill. 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    Past Medical History  Diagnosis Date  . Muscle spasm of back     takes Flexeril as needed  . Weakness     numbness and tingling both feet  . Chronic back pain     spondylolisthesis and radiculopathy  . Depression     hx of-per pt no meds now   Past Surgical History  Procedure Laterality Date  . Right 5th finger surgery      in high school  . Back surgery     History reviewed. No pertinent family history. History  Substance Use Topics  . Smoking status: Current Every Day Smoker -- 0.50 packs/day for 32 years    Types: Cigarettes  . Smokeless tobacco: Not on file  . Alcohol Use: No    Review of Systems  All other systems reviewed and are negative.   Allergies  Penicillins; Bee venom; and Other  Home Medications   Prior to Admission medications   Medication Sig Start Date End Date Taking? Authorizing Provider  acetaminophen (TYLENOL) 500 MG tablet Take 1,000 mg by mouth every 6 (six) hours as needed for moderate pain.    Historical Provider, MD  azithromycin (ZITHROMAX Z-PAK) 250 MG tablet Take 2 tabs the first day and then 1 tab daily through day 5 10/08/14   Silas FloodErik Proulx, MD  cyclobenzaprine (FLEXERIL) 10 MG tablet Take 1 tablet (10 mg total) by mouth 2  (two) times daily as needed for muscle spasms. 09/05/12   Felicie Mornavid Smith, NP  diazepam (VALIUM) 5 MG tablet Take 1 tablet (5 mg total) by mouth every 6 (six) hours as needed for muscle spasms. 09/06/13   Barnett AbuHenry Elsner, MD  EPINEPHrine (EPIPEN 2-PAK) 0.3 mg/0.3 mL IJ SOAJ injection Inject 0.3 mLs (0.3 mg total) into the muscle once. 10/14/14   Mathis FareJennifer Lee H Jazmynn Pho, PA  ibuprofen (ADVIL,MOTRIN) 800 MG tablet Take 1 tablet (800 mg total) by mouth 3 (three) times daily. 08/11/14   Graylon GoodZachary H Baker, PA-C  lisinopril-hydrochlorothiazide (PRINZIDE) 20-12.5 MG per tablet Take 1 tablet by mouth daily. 09/02/14   Rodolph BongEvan S Corey, MD  naproxen (NAPROSYN) 375 MG tablet Take 375 mg by mouth 2 (two) times daily with a meal.    Historical Provider, MD  oxyCODONE-acetaminophen (PERCOCET) 10-325 MG per tablet Take 1 tablet by mouth 2 (two) times daily as needed for pain. 08/13/14   Rodolph BongEvan S Corey, MD   BP 112/80 mmHg  Pulse 80  Temp(Src) 97.8 F (36.6 C) (Oral)  Resp 16  SpO2 100% Physical Exam  Constitutional: He is oriented to person, place, and time. He appears well-developed and well-nourished.  HENT:  Head: Normocephalic and atraumatic.  Right Ear: External ear normal.  Left Ear: External ear normal.  Nose: Nose normal.  Mouth/Throat: Oropharynx is  clear and moist.  Eyes: Conjunctivae are normal.  Neck: Normal range of motion. Neck supple.  Cardiovascular: Normal rate, regular rhythm and normal heart sounds.   Pulmonary/Chest: Effort normal and breath sounds normal. No respiratory distress. He has no wheezes.  Lymphadenopathy:    He has no cervical adenopathy.  Neurological: He is alert and oriented to person, place, and time.  Skin: Skin is warm and dry.  Psychiatric: He has a normal mood and affect. His behavior is normal.  Nursing note and vitals reviewed.   ED Course  Procedures (including critical care time) Labs Review Labs Reviewed - No data to display  Imaging Review No results found.   MDM    1. CAP (community acquired pneumonia)    Exam normal Vitals normal. Provided patient with copy of CXR report from 10/09/2014. Advised him that he will still need PCP follow up because CXR was suggestive of left mid-lung density in addition to RLL infiltrate and it was recommended he undergo Chest CT to further evaluate. He has been smoking for 33 years and I strongly encouraged him to follow up with PCP to undergo additional imaging as advised    Ria Clock, PA 10/14/14 1009

## 2014-10-14 NOTE — ED Notes (Signed)
Pt  Was  Seen  6   Days  Ago in  [place]  For  pnuemonia      Just  Finished   Anti  Biotics   Here  Today  For  Follow  As  He  Was  Unable  To  Get  An  appt  With  His  pcp    -  He  States  He  Is  Feeling better    Pt   Also  Is  Requesting  An  Epi  Pen   To  Have  On  Hand

## 2015-05-06 ENCOUNTER — Emergency Department (HOSPITAL_COMMUNITY)
Admission: EM | Admit: 2015-05-06 | Discharge: 2015-05-06 | Disposition: A | Discharge status: Home / Self Care | Payer: No Typology Code available for payment source | Attending: Emergency Medicine | Admitting: Emergency Medicine

## 2015-05-06 ENCOUNTER — Emergency Department (HOSPITAL_COMMUNITY): Payer: No Typology Code available for payment source

## 2015-05-06 ENCOUNTER — Encounter (HOSPITAL_COMMUNITY): Payer: Self-pay | Admitting: Nurse Practitioner

## 2015-05-06 DIAGNOSIS — Z720 Tobacco use: Secondary | ICD-10-CM | POA: Diagnosis not present

## 2015-05-06 DIAGNOSIS — Z880 Allergy status to penicillin: Secondary | ICD-10-CM | POA: Insufficient documentation

## 2015-05-06 DIAGNOSIS — F329 Major depressive disorder, single episode, unspecified: Secondary | ICD-10-CM | POA: Insufficient documentation

## 2015-05-06 DIAGNOSIS — R05 Cough: Secondary | ICD-10-CM | POA: Insufficient documentation

## 2015-05-06 DIAGNOSIS — R0602 Shortness of breath: Secondary | ICD-10-CM | POA: Insufficient documentation

## 2015-05-06 DIAGNOSIS — Z791 Long term (current) use of non-steroidal anti-inflammatories (NSAID): Secondary | ICD-10-CM | POA: Insufficient documentation

## 2015-05-06 DIAGNOSIS — Z792 Long term (current) use of antibiotics: Secondary | ICD-10-CM | POA: Insufficient documentation

## 2015-05-06 DIAGNOSIS — R110 Nausea: Secondary | ICD-10-CM | POA: Insufficient documentation

## 2015-05-06 DIAGNOSIS — R000 Tachycardia, unspecified: Secondary | ICD-10-CM | POA: Insufficient documentation

## 2015-05-06 DIAGNOSIS — I10 Essential (primary) hypertension: Secondary | ICD-10-CM | POA: Insufficient documentation

## 2015-05-06 DIAGNOSIS — R0781 Pleurodynia: Secondary | ICD-10-CM | POA: Diagnosis not present

## 2015-05-06 DIAGNOSIS — R079 Chest pain, unspecified: Secondary | ICD-10-CM | POA: Diagnosis present

## 2015-05-06 DIAGNOSIS — R42 Dizziness and giddiness: Secondary | ICD-10-CM | POA: Diagnosis not present

## 2015-05-06 DIAGNOSIS — G8929 Other chronic pain: Secondary | ICD-10-CM | POA: Diagnosis not present

## 2015-05-06 DIAGNOSIS — R059 Cough, unspecified: Secondary | ICD-10-CM

## 2015-05-06 DIAGNOSIS — Z79899 Other long term (current) drug therapy: Secondary | ICD-10-CM | POA: Insufficient documentation

## 2015-05-06 HISTORY — DX: Essential (primary) hypertension: I10

## 2015-05-06 LAB — CBC
HCT: 41.4 % (ref 39.0–52.0)
Hemoglobin: 14.3 g/dL (ref 13.0–17.0)
MCH: 29.4 pg (ref 26.0–34.0)
MCHC: 34.5 g/dL (ref 30.0–36.0)
MCV: 85.2 fL (ref 78.0–100.0)
Platelets: 306 10*3/uL (ref 150–400)
RBC: 4.86 MIL/uL (ref 4.22–5.81)
RDW: 12.6 % (ref 11.5–15.5)
WBC: 3.7 10*3/uL — ABNORMAL LOW (ref 4.0–10.5)

## 2015-05-06 LAB — BASIC METABOLIC PANEL
Anion gap: 10 (ref 5–15)
BUN: 7 mg/dL (ref 6–20)
CO2: 25 mmol/L (ref 22–32)
Calcium: 9.3 mg/dL (ref 8.9–10.3)
Chloride: 103 mmol/L (ref 101–111)
Creatinine, Ser: 1.26 mg/dL — ABNORMAL HIGH (ref 0.61–1.24)
GFR calc Af Amer: >60 mL/min (ref >60)
GFR calc non Af Amer: >60 mL/min (ref >60)
Glucose, Bld: 130 mg/dL — ABNORMAL HIGH (ref 65–99)
Potassium: 3.7 mmol/L (ref 3.5–5.1)
Sodium: 138 mmol/L (ref 135–145)

## 2015-05-06 LAB — I-STAT TROPONIN, ED: Troponin i, poc: 0 ng/mL (ref 0.00–0.08)

## 2015-05-06 LAB — D-DIMER, QUANTITATIVE: D-Dimer, Quant: 0.59 ug/mL-FEU — ABNORMAL HIGH (ref 0.00–0.48)

## 2015-05-06 MED ORDER — FENTANYL CITRATE (PF) 100 MCG/2ML IJ SOLN: 50.0000 ug | Freq: Once | INTRAMUSCULAR | Status: DC

## 2015-05-06 MED ORDER — SODIUM CHLORIDE 0.9 % IV BOLUS (SEPSIS)
1000.0000 mL | Freq: Once | INTRAVENOUS | Status: AC
  Administered 2015-05-06: 1000 mL via INTRAVENOUS

## 2015-05-06 MED ORDER — IOHEXOL 350 MG/ML SOLN
80.0000 mL | Freq: Once | INTRAVENOUS | Status: AC | PRN
  Administered 2015-05-06: 80 mL via INTRAVENOUS

## 2015-05-06 MED ORDER — PREDNISONE 20 MG PO TABS
60.0000 mg | ORAL_TABLET | Freq: Once | ORAL | Status: AC
  Administered 2015-05-06: 60 mg via ORAL
  Filled 2015-05-06: qty 3

## 2015-05-06 MED ORDER — ALBUTEROL SULFATE (2.5 MG/3ML) 0.083% IN NEBU
5.0000 mg | INHALATION_SOLUTION | Freq: Once | RESPIRATORY_TRACT | Status: AC
  Administered 2015-05-06: 5 mg via RESPIRATORY_TRACT
  Filled 2015-05-06: qty 6

## 2015-05-06 MED ORDER — IPRATROPIUM BROMIDE 0.02 % IN SOLN
0.5000 mg | Freq: Once | RESPIRATORY_TRACT | Status: AC
  Administered 2015-05-06: 0.5 mg via RESPIRATORY_TRACT
  Filled 2015-05-06: qty 2.5

## 2015-05-06 MED ORDER — PREDNISONE 20 MG PO TABS: 40.0000 mg | ORAL_TABLET | Freq: Every day | ORAL | Status: DC

## 2015-05-06 MED ORDER — NAPROXEN 500 MG PO TABS: 500.0000 mg | ORAL_TABLET | Freq: Two times a day (BID) | ORAL | Status: DC

## 2015-05-06 MED ORDER — ASPIRIN 81 MG PO CHEW
324.0000 mg | CHEWABLE_TABLET | Freq: Once | ORAL | Status: AC
  Administered 2015-05-06: 324 mg via ORAL
  Filled 2015-05-06: qty 4

## 2015-05-06 MED ORDER — BENZONATATE 100 MG PO CAPS: 100.0000 mg | ORAL_CAPSULE | Freq: Three times a day (TID) | ORAL | Status: DC

## 2015-05-06 MED ORDER — IBUPROFEN 200 MG PO TABS
600.0000 mg | ORAL_TABLET | Freq: Once | ORAL | Status: AC
  Administered 2015-05-06: 600 mg via ORAL
  Filled 2015-05-06 (×2): qty 1

## 2015-05-06 NOTE — ED Notes (Signed)
Pt returned from CT °

## 2015-05-06 NOTE — Discharge Instructions (Signed)
Take prednisone and Naproxen as prescribed. Continue to use your albuterol inhaler, 2 puffs every 4-6 hours as needed for cough and shortness of breath. Take Tessalon as needed for coughing. Follow up with your primary care doctor if symptoms persist. Return to the ED as needed if symptoms worsen.  Costochondritis Costochondritis, sometimes called Tietze syndrome, is a swelling and irritation (inflammation) of the tissue (cartilage) that connects your ribs with your breastbone (sternum). It causes pain in the chest and rib area. Costochondritis usually goes away on its own over time. It can take up to 6 weeks or longer to get better, especially if you are unable to limit your activities. CAUSES  Some cases of costochondritis have no known cause. Possible causes include:  Injury (trauma).  Exercise or activity such as lifting.  Severe coughing. SIGNS AND SYMPTOMS  Pain and tenderness in the chest and rib area.  Pain that gets worse when coughing or taking deep breaths.  Pain that gets worse with specific movements. DIAGNOSIS  Your health care provider will do a physical exam and ask about your symptoms. Chest X-rays or other tests may be done to rule out other problems. TREATMENT  Costochondritis usually goes away on its own over time. Your health care provider may prescribe medicine to help relieve pain. HOME CARE INSTRUCTIONS   Avoid exhausting physical activity. Try not to strain your ribs during normal activity. This would include any activities using chest, abdominal, and side muscles, especially if heavy weights are used.  Apply ice to the affected area for the first 2 days after the pain begins.  Put ice in a plastic bag.  Place a towel between your skin and the bag.  Leave the ice on for 20 minutes, 2-3 times a day.  Only take over-the-counter or prescription medicines as directed by your health care provider. SEEK MEDICAL CARE IF:  You have redness or swelling at the rib  joints. These are signs of infection.  Your pain does not go away despite rest or medicine. SEEK IMMEDIATE MEDICAL CARE IF:   Your pain increases or you are very uncomfortable.  You have shortness of breath or difficulty breathing.  You cough up blood.  You have worse chest pains, sweating, or vomiting.  You have a fever or persistent symptoms for more than 2-3 days.  You have a fever and your symptoms suddenly get worse. MAKE SURE YOU:   Understand these instructions.  Will watch your condition.  Will get help right away if you are not doing well or get worse.   This information is not intended to replace advice given to you by your health care provider. Make sure you discuss any questions you have with your health care provider.   Document Released: 04/25/2005 Document Revised: 05/06/2013 Document Reviewed: 02/17/2013 Elsevier Interactive Patient Education 2016 Else[employer] Inc[employer]  Cough, Adult Coughing is a reflex that clears your throat and your airways. Coughing helps to heal and protect your lungs. It is normal to cough occasionally, but a cough that happens with other symptoms or lasts a long time may be a sign of a condition that needs treatment. A cough may last only 2-3 weeks (acute), or it may last longer than 8 weeks (chronic). CAUSES Coughing is commonly caused by:  Breathing in substances that irritate your lungs.  A viral or bacterial respiratory infection.  Allergies.  Asthma.  Postnasal drip.  Smoking.  Acid backing up from the stomach into the esophagus (gastroesophageal reflux).  Certain medicines.  Chronic lung problems, including COPD (or rarely, lung cancer).  Other medical conditions such as heart failure. HOME CARE INSTRUCTIONS  Pay attention to any changes in your symptoms. Take these actions to help with your discomfort:  Take medicines only as told by your health care provider.  If you were prescribed an antibiotic medicine, take it as  told by your health care provider. Do not stop taking the antibiotic even if you start to feel better.  Talk with your health care provider before you take a cough suppressant medicine.  Drink enough fluid to keep your urine clear or pale yellow.  If the air is dry, use a cold steam vaporizer or humidifier in your bedroom or your home to help loosen secretions.  Avoid anything that causes you to cough at work or at home.  If your cough is worse at night, try sleeping in a semi-upright position.  Avoid cigarette smoke. If you smoke, quit smoking. If you need help quitting, ask your health care provider.  Avoid caffeine.  Avoid alcohol.  Rest as needed. SEEK MEDICAL CARE IF:   You have new symptoms.  You cough up pus.  Your cough does not get better after 2-3 weeks, or your cough gets worse.  You cannot control your cough with suppressant medicines and you are losing sleep.  You develop pain that is getting worse or pain that is not controlled with pain medicines.  You have a fever.  You have unexplained weight loss.  You have night sweats. SEEK IMMEDIATE MEDICAL CARE IF:  You cough up blood.  You have difficulty breathing.  Your heartbeat is very fast.   This information is not intended to replace advice given to you by your health care provider. Make sure you discuss any questions you have with your health care provider.   Document Released: 01/12/2011 Document Revised: 04/06/2015 Document Reviewed: 09/22/2014 Elsevier Interactive Patient Education [employer]

## 2015-05-06 NOTE — ED Notes (Addendum)
He c/o CP, SOB, dizziness, diarrhea increasingly worse over past week. He has been to his PCP several times for this with no relief of symptoms. States 2 weeks ago he initially had a fever and nausea and was started on oral abx which he did not complete because they caused a skin rash that is still present. He returned to PCP and was given inhaler but that did not relieve the symptoms and he is getting worse. He is A&Ox4, resp e/u

## 2015-05-06 NOTE — ED Notes (Signed)
PT receiving breathing tx at this time

## 2015-05-06 NOTE — ED Provider Notes (Signed)
2105 - Patient care assumed from Heather Laisure, PA-C at shift change. Dimer pending which is slightly positive. Results discussed with patient as well as need to proceed with CTA. Patient verbalizes understanding. He also is requesting a nebulizer treatment for c/o SOB. SpO2 remains over 95% on RA. DuoNeb ordered.  2245 - CTA negative for PE. Will check pulse ox with ambulation with plan to d/c if no hypoxia.  2300 - SpO2 95-96% on RA with ambulation. Will d/c with instruction for PCP f/u. Patient given prescriptions for Naproxen, prednisone burst, and Tessalon for symptom control. Discharged in good condition.   Filed Vitals:   05/06/15 1930 05/06/15 2000 05/06/15 2030 05/06/15 2100  BP: 100/65 107/81 106/83 120/92  Pulse: 107 111 95 101  Temp:      Resp: 24 27 21 26   SpO2: 100% 100% 100% 99%   Results for orders placed or performed during the hospital encounter of 05/06/15  Basic metabolic panel  Result Value Ref Range   Sodium 138 135 - 145 mmol/L   Potassium 3.7 3.5 - 5.1 mmol/L   Chloride 103 101 - 111 mmol/L   CO2 25 22 - 32 mmol/L   Glucose, Bld 130 (H) 65 - 99 mg/dL   BUN 7 6 - 20 mg/dL   Creatinine, Ser 1.26 (H) 0.61 - 1.24 mg/dL   Calcium 9.3 8.9 - 10.3 mg/dL   GFR calc non Af Amer >60 >60 mL/min   GFR calc Af Amer >60 >60 mL/min   Anion gap 10 5 - 15  CBC  Result Value Ref Range   WBC 3.7 (L) 4.0 - 10.5 K/uL   RBC 4.86 4.22 - 5.81 MIL/uL   Hemoglobin 14.3 13.0 - 17.0 g/dL   HCT 41.4 39.0 - 52.0 %   MCV 85.2 78.0 - 100.0 fL   MCH 29.4 26.0 - 34.0 pg   MCHC 34.5 30.0 - 36.0 g/dL   RDW 12.6 11.5 - 15.5 %   Platelets 306 150 - 400 K/uL  D-dimer, quantitative (not at [facility])  Result Value Ref Range   D-Dimer, Quant 0.59 (H) 0.00 - 0.48 ug/mL-FEU  I-stat troponin, ED  Result Value Ref Range   Troponin i, poc 0.00 0.00 - 0.08 ng/mL   Comment 3           Dg Chest 2 View  05/06/2015   CLINICAL DATA:  Central chest pain for the last few days. Pain worse with deep  breathing. Worsening shortness of breath.  EXAM: CHEST  2 VIEW  COMPARISON:  04/26/2015  FINDINGS: The heart size and mediastinal contours are within normal limits. Both lungs are clear. The visualized skeletal structures are unremarkable.  IMPRESSION: Normal chest   Electronically Signed   By: Mark  Shogry M.D.   On: 05/06/2015 19:04   Ct Angio Chest Pe W/cm &/or Wo Cm  05/06/2015   CLINICAL DATA:  Acute onset of shortness of breath and pleuritic chest pain. Initial encounter.  EXAM: CT ANGIOGRAPHY CHEST WITH CONTRAST  TECHNIQUE: Multidetector CT imaging of the chest was performed using the standard protocol during bolus administration of intravenous contrast. Multiplanar CT image reconstructions a22[facility]at[facility]35mr812[facility]59[facility] Healt32m 757-[facility] CenterK[facility] LPEArizonangiHogan Surgery CenSh .m.  FINDINGS: There is no evidence of significant pulmonary embolus.  A few blebs are seen at the lung apices. There is no evidence of significant focal consolidation, pleural effusion or  pneumothorax. No masses are identified; no abnormal focal contrast enhancement is seen.  The mediastinum is unremarkable appearance. No mediastinal lymphadenopathy is seen. No pericardial effusion is identified. The great vessels are grossly unremarkable in appearance. No axillary lymphadenopathy is seen. The thyroid gland is unremarkable in appearance.  The visualized portions of the liver and spleen are unremarkable. The visualized portions of the pancreas, gallbladder, stomach, adrenal glands and kidneys are within normal limits.  No acute osseous abnormalities are seen.  Review of the MIP images confirms the above findings.  IMPRESSION: 1. No evidence of significant pulmonary embolus. 2. Few blebs noted at the lung apices.  Lungs otherwise clear.   Electronically Signed   By: Roanna Raider M.D.   On: 05/06/2015 22:36      Antony Madura, PA-C 05/06/15  2303  Raeford Razor, MD 05/06/15 [phone]

## 2015-05-06 NOTE — ED Provider Notes (Signed)
CSN: [id number]     Arrival date & time 05/06/15  1757 History   First MD Initiated Contact with Patient 05/06/15 1835     Chief Complaint  Patient presents with  . Chest Pain  . Dizziness  . Shortness of Breath     (Consider location/radiation/quality/duration/timing/severity/associated sxs/prior Treatment) HPI Comments: Patient presents today with a chief complaint of chest pain.  He states that the pain has been present for the past 2-3 days and is constant.  He reports that he was seen by his PCP last week for cough and was diagnosed with a "bacterial infection."  He is not sure what the actual diagnosis was, but he was started on Doxycycline at that time.  He states that he took the medication for four days and then developed a rash.  He then stopped the Doxycycline 5 days ago.  He then returned to the office of his PCP three days and was given Rx for Albuterol inhaler.  He states that he has been using the Albuterol inhaler over the past few days with mild relief, but did not use the inhaler today.  He reports that his chest pain is located across his chest and does radiate.  He reports that the pain is worse with deep inspiration and with cough.  He reports associated nausea, but denies vomiting.  Denies fever or chills.  Denies LE edema.  He reports associated dizziness and headache, but denies syncope.  He describes the dizziness as feeling lightheaded.  He denies history of PE or DVT.  Denies prolonged travel or surgeries in the past 4 weeks.  Denies hemoptysis.  Denies LE edema or swelling.  He reports history of HTN, but denies history of DM, Hyperlipidemia, or any cardiac history.  He denies vision changes, fever, chills, neck pain, focal weakness, numbness, or tingling.  The history is provided by the patient.    Past Medical History  Diagnosis Date  . Muscle spasm of back     takes Flexeril as needed  . Weakness     numbness and tingling both feet  . Chronic back pain    spondylolisthesis and radiculopathy  . Depression     hx of-per pt no meds now   Past Surgical History  Procedure Laterality Date  . Right 5th finger surgery      in high school  . Back surgery     History reviewed. No pertinent family history. Social History  Substance Use Topics  . Smoking status: Current Every Day Smoker -- 0.50 packs/day for 32 years    Types: Cigarettes  . Smokeless tobacco: None  . Alcohol Use: No    Review of Systems  All other systems reviewed and are negative.     Allergies  Penicillins; Morphine and related; Bee venom; and Other  Home Medications   Prior to Admission medications   Medication Sig Start Date End Date Taking? Authorizing Provider  acetaminophen (TYLENOL) 500 MG tablet Take 1,000 mg by mouth every 6 (six) hours as needed for moderate pain.    Historical Provider, MD  azithromycin (ZITHROMAX Z-PAK) 250 MG tablet Take 2 tabs the first day and then 1 tab daily through day 5 10/08/14   Silas Flood, MD  cyclobenzaprine (FLEXERIL) 10 MG tablet Take 1 tablet (10 mg total) by mouth 2 (two) times daily as needed for muscle spasms. 09/05/12   Felicie Morn, NP  diazepam (VALIUM) 5 MG tablet Take 1 tablet (5 mg total) by mouth every 6 (  six) hours as needed for muscle spasms. 09/06/13   Barnett Abu, MD  EPINEPHrine (EPIPEN 2-PAK) 0.3 mg/0.3 mL IJ SOAJ injection Inject 0.3 mLs (0.3 mg total) into the muscle once. 10/14/14   Mathis Fare Presson, PA  ibuprofen (ADVIL,MOTRIN) 800 MG tablet Take 1 tablet (800 mg total) by mouth 3 (three) times daily. 08/11/14   Graylon Good, PA-C  lisinopril-hydrochlorothiazide (PRINZIDE) 20-12.5 MG per tablet Take 1 tablet by mouth daily. 09/02/14   Rodolph Bong, MD  naproxen (NAPROSYN) 375 MG tablet Take 375 mg by mouth 2 (two) times daily with a meal.    Historical Provider, MD  oxyCODONE-acetaminophen (PERCOCET) 10-325 MG per tablet Take 1 tablet by mouth 2 (two) times daily as needed for pain. 08/13/14   Rodolph Bong, MD    BP 120/82 mmHg  Pulse 128  Temp(Src) 98.1 F (36.7 C)  Resp 18  SpO2 99% Physical Exam  Constitutional: He appears well-developed and well-nourished.  HENT:  Head: Normocephalic and atraumatic.  Mouth/Throat: Oropharynx is clear and moist.  Neck: Normal range of motion. Neck supple.  Cardiovascular: Normal rate, regular rhythm, normal heart sounds and intact distal pulses.   Pulses:      Dorsalis pedis pulses are 2+ on the right side, and 2+ on the left side.  Pulmonary/Chest: Effort normal and breath sounds normal. No respiratory distress. He has no wheezes. He has no rales. He exhibits no tenderness.  Abdominal: Soft. Bowel sounds are normal. He exhibits no distension and no mass. There is no tenderness. There is no rebound and no guarding.  Musculoskeletal: Normal range of motion.  No LE edema bilaterally  Neurological: He is alert.  Skin: Skin is warm and dry.  Psychiatric: He has a normal mood and affect.  Nursing note and vitals reviewed.   ED Course  Procedures (including critical care time) Labs Review Labs Reviewed  CBC - Abnormal; Notable for the following:    WBC 3.7 (*)    All other components within normal limits  BASIC METABOLIC PANEL    Imaging Review Dg Chest 2 View  05/06/2015   CLINICAL DATA:  Central chest pain for the last few days. Pain worse with deep breathing. Worsening shortness of breath.  EXAM: CHEST  2 VIEW  COMPARISON:  04/26/2015  FINDINGS: The heart size and mediastinal contours are within normal limits. Both lungs are clear. The visualized skeletal structures are unremarkable.  IMPRESSION: Normal chest   Electronically Signed   By: Paulina Fusi M.D.   On: 05/06/2015 19:04   I have personally reviewed and evaluated these images and lab results as part of my medical decision-making.   EKG Interpretation   Date/Time:  Friday May 06 2015 18:01:44 EDT Ventricular Rate:  126 PR Interval:  134 QRS Duration: 74 QT Interval:  298 QTC  Calculation: 431 R Axis:   64 Text Interpretation:  Sinus tachycardia Non-specific ST-t changes inferior  ST changes not to some degree on previous from 2011 Abnormal ECG Confirmed  by KOHUT  MD, STEPHEN ([id number]) on 05/06/2015 9:59:34 PM      MDM   Final diagnoses:  None   Patient presents today with chest pain, SOB, and dizziness.  He reports that the CP has been constant for the past 2-3 days.  No ischemic changes on EKG.  Troponin negative.  Therefore, doubt ACS.  CXR negative.  Patient is not hypoxic, but was found to be tachycardic.  Therefore, d-dimer ordered to assess for possible  PE.  He does not have significant risk factors for PE, therefore, feel that he can be screened with a d-dimer.  Patient signed out to [facility], PA-C at shift change.    Santiago Glad, PA-C 05/07/15 2216  Raeford Razor, MD 05/12/15 [phone]

## 2016-02-11 ENCOUNTER — Encounter (HOSPITAL_COMMUNITY): Payer: Self-pay

## 2016-02-11 ENCOUNTER — Emergency Department (HOSPITAL_COMMUNITY)
Admission: EM | Admit: 2016-02-11 | Discharge: 2016-02-11 | Disposition: A | Discharge status: Home / Self Care | Payer: BLUE CROSS/BLUE SHIELD | Attending: Emergency Medicine | Admitting: Emergency Medicine

## 2016-02-11 DIAGNOSIS — I10 Essential (primary) hypertension: Secondary | ICD-10-CM | POA: Insufficient documentation

## 2016-02-11 DIAGNOSIS — R21 Rash and other nonspecific skin eruption: Secondary | ICD-10-CM | POA: Diagnosis present

## 2016-02-11 DIAGNOSIS — F17210 Nicotine dependence, cigarettes, uncomplicated: Secondary | ICD-10-CM | POA: Insufficient documentation

## 2016-02-11 DIAGNOSIS — B029 Zoster without complications: Secondary | ICD-10-CM | POA: Diagnosis not present

## 2016-02-11 MED ORDER — IBUPROFEN 200 MG PO TABS
600.0000 mg | ORAL_TABLET | Freq: Once | ORAL | Status: AC
  Administered 2016-02-11: 600 mg via ORAL
  Filled 2016-02-11: qty 1

## 2016-02-11 MED ORDER — VALACYCLOVIR HCL 1 G PO TABS: 1000.0000 mg | ORAL_TABLET | Freq: Three times a day (TID) | ORAL | Status: AC

## 2016-02-11 NOTE — ED Notes (Signed)
States only wants Ibuprofen for pain. States takes routinely and is past due.

## 2016-02-11 NOTE — Discharge Instructions (Signed)
Take the valacyclovir as prescribed and take over-the-counter ibuprofen as needed for pain. Follow-up with your primary care provider early next week to be seen regarding your rash to have it reevaluated.  Return to emergency department if you experience increased redness warmth and swelling around the rash, fever, chills, vomiting, or any other concerning symptoms.  Shingles Shingles, which is also known as herpes zoster, is an infection that causes a painful skin rash and fluid-filled blisters. Shingles is not related to genital herpes, which is a sexually transmitted infection.   Shingles only develops in people who:  Have had chickenpox.  Have received the chickenpox vaccine. (This is rare.) CAUSES Shingles is caused by varicella-zoster virus (VZV). This is the same virus that causes chickenpox. After exposure to VZV, the virus stays in the body in an inactive (dormant) state. Shingles develops if the virus reactivates. This can happen many years after the initial exposure to VZV. It is not known what causes this virus to reactivate. RISK FACTORS People who have had chickenpox or received the chickenpox vaccine are at risk for shingles. Infection is more common in people who:  Are older than age 75.  Have a weakened defense (immune) system, such as those with HIV, AIDS, or cancer.  Are taking medicines that weaken the immune system, such as transplant medicines.  Are under great stress. SYMPTOMS Early symptoms of this condition include itching, tingling, and pain in an area on your skin. Pain may be described as burning, stabbing, or throbbing. A few days or weeks after symptoms start, a painful red rash appears, usually on one side of the body in a bandlike or beltlike pattern. The rash eventually turns into fluid-filled blisters that break open, scab over, and dry up in about 2-3 weeks. At any time during the infection, you may also develop:  A fever.  Chills.  A  headache.  An upset stomach. DIAGNOSIS This condition is diagnosed with a skin exam. Sometimes, skin or fluid samples are taken from the blisters before a diagnosis is made. These samples are examined under a microscope or sent to a lab for testing. TREATMENT There is no specific cure for this condition. Your health care provider will probably prescribe medicines to help you manage pain, recover more quickly, and avoid long-term problems. Medicines may include:  Antiviral drugs.  Anti-inflammatory drugs.  Pain medicines. If the area involved is on your face, you may be referred to a specialist, such as an eye doctor (ophthalmologist) or an ear, nose, and throat (ENT) doctor to help you avoid eye problems, chronic pain, or disability. HOME CARE INSTRUCTIONS Medicines  Take medicines only as directed by your health care provider.  Apply an anti-itch or numbing cream to the affected area as directed by your health care provider. Blister and Rash Care  Take a cool bath or apply cool compresses to the area of the rash or blisters as directed by your health care provider. This may help with pain and itching.  Keep your rash covered with a loose bandage (dressing). Wear loose-fitting clothing to help ease the pain of material rubbing against the rash.  Keep your rash and blisters clean with mild soap and cool water or as directed by your health care provider.  Check your rash every day for signs of infection. These include redness, swelling, and pain that lasts or increases.  Do not pick your blisters.  Do not scratch your rash. General Instructions  Rest as directed by your health care  provider.  Keep all follow-up visits as directed by your health care provider. This is important.  Until your blisters scab over, your infection can cause chickenpox in people who have never had it or been vaccinated against it. To prevent this from happening, avoid contact with other people,  especially:  Babies.  Pregnant women.  Children who have eczema.  Elderly people who have transplants.  People who have chronic illnesses, such as leukemia or AIDS. SEEK MEDICAL CARE IF:  Your pain is not relieved with prescribed medicines.  Your pain does not get better after the rash heals.  Your rash looks infected. Signs of infection include redness, swelling, and pain that lasts or increases. SEEK IMMEDIATE MEDICAL CARE IF:  The rash is on your face or nose.  You have facial pain, pain around your eye area, or loss of feeling on one side of your face.  You have ear pain or you have ringing in your ear.  You have loss of taste.  Your condition gets worse.   This information is not intended to replace advice given to you by your health care provider. Make sure you discuss any questions you have with your health care provider.   Document Released: 07/16/2005 Document Revised: 08/06/2014 Document Reviewed: 05/27/2014 Elsevier Interactive Patient Education [employer]2016 [employer]vier Inc.

## 2016-02-11 NOTE — ED Provider Notes (Signed)
CSN: [id number]     Arrival date & time 02/11/16  1353 History  By signing my name below, I, Freida Busman, attest that this documentation has been prepared under the direction and in the presence of non-physician practitioner, Mattie Marlin, PA-C. Electronically Signed: Freida Busman, Scribe. 02/11/2016. 5:08 PM.    Chief Complaint  Patient presents with  . Insect Bite   The history is provided by the patient. No language interpreter was used.   HPI Comments:  Jose Schultz is a 50 y.o. male who presents to the Emergency Department complaining of "bubbling" rash to his LLE x 3 days. He describes the site as pruritic and reports a burning, sharp pain. He notes they have progressively worsened and extends from the lower leg up to his thigh since.  He denies rash to the his buttocks and back. Pt notes he was bitten by an insect on his LLE ~4 days ago but did not see what bit him. No alleviating factors noted. He denies fever, chills, vomiting, HA, abdominal pain,Tick bite, recent travel, and dizziness.   Past Medical History  Diagnosis Date  . Muscle spasm of back     takes Flexeril as needed  . Weakness     numbness and tingling both feet  . Chronic back pain     spondylolisthesis and radiculopathy  . Depression     hx of-per pt no meds now  . Hypertension    Past Surgical History  Procedure Laterality Date  . Right 5th finger surgery      in high school  . Back surgery     No family history on file. Social History  Substance Use Topics  . Smoking status: Current Every Day Smoker -- 0.50 packs/day for 32 years    Types: Cigarettes  . Smokeless tobacco: None  . Alcohol Use: No    Review of Systems  Constitutional: Negative for fever and chills.  Respiratory: Negative for shortness of breath.   Cardiovascular: Negative for chest pain.  Gastrointestinal: Negative for vomiting and abdominal pain.  Skin: Positive for rash.  Neurological: Negative for headaches.     Allergies  Penicillins; Doxycycline; Morphine and related; Bee venom; and Other  Home Medications   Prior to Admission medications   Medication Sig Start Date End Date Taking? Authorizing Provider  albuterol (PROVENTIL HFA;VENTOLIN HFA) 108 (90 BASE) MCG/ACT inhaler Inhale 1 puff into the lungs every 6 (six) hours as needed for wheezing or shortness of breath.    Historical Provider, MD  azithromycin (ZITHROMAX Z-PAK) 250 MG tablet Take 2 tabs the first day and then 1 tab daily through day 5 Patient not taking: Reported on 05/06/2015 10/08/14   Silas Flood, MD  benzonatate (TESSALON) 100 MG capsule Take 1 capsule (100 mg total) by mouth every 8 (eight) hours. 05/06/15   Antony Madura, PA-C  cyclobenzaprine (FLEXERIL) 10 MG tablet Take 1 tablet (10 mg total) by mouth 2 (two) times daily as needed for muscle spasms. Patient not taking: Reported on 05/06/2015 09/05/12   Felicie Morn, NP  diazepam (VALIUM) 5 MG tablet Take 1 tablet (5 mg total) by mouth every 6 (six) hours as needed for muscle spasms. Patient not taking: Reported on 05/06/2015 09/06/13   Barnett Abu, MD  EPINEPHrine (EPIPEN 2-PAK) 0.3 mg/0.3 mL IJ SOAJ injection Inject 0.3 mLs (0.3 mg total) into the muscle once. 10/14/14   Mathis Fare Presson, PA  gabapentin (NEURONTIN) 600 MG tablet Take 1,200 mg by mouth 2 (two)  times daily.    Historical Provider, MD  ibuprofen (ADVIL,MOTRIN) 800 MG tablet Take 1 tablet (800 mg total) by mouth 3 (three) times daily. 08/11/14   Graylon GoodZachary H Baker, PA-C  lisinopril-hydrochlorothiazide (PRINZIDE) 20-12.5 MG per tablet Take 1 tablet by mouth daily. 09/02/14   Rodolph BongEvan S Corey, MD  naproxen (NAPROSYN) 500 MG tablet Take 1 tablet (500 mg total) by mouth 2 (two) times daily. As needed for chest pain 05/06/15   Antony MaduraKelly Humes, PA-C  oxyCODONE-acetaminophen (PERCOCET) 10-325 MG per tablet Take 1 tablet by mouth 2 (two) times daily as needed for pain. Patient taking differently: Take 1 tablet by mouth 4 (four) times  daily.  08/13/14   Rodolph BongEvan S Corey, MD  predniSONE (DELTASONE) 20 MG tablet Take 2 tablets (40 mg total) by mouth daily. 05/06/15   Antony MaduraKelly Humes, PA-C  valACYclovir (VALTREX) 1000 MG tablet Take 1 tablet (1,000 mg total) by mouth 3 (three) times daily. 02/11/16 02/25/16  Timera Windt L Roslynn Holte, PA   BP 131/94 mmHg  Pulse 89  Temp(Src) 97.6 F (36.4 C) (Oral)  Resp 14  SpO2 99% Physical Exam  Constitutional: He appears well-developed and well-nourished. No distress.  HENT:  Head: Normocephalic and atraumatic.  Eyes: Conjunctivae are normal.  Cardiovascular:  Pulses:      Dorsalis pedis pulses are 2+ on the right side, and 2+ on the left side.  Pulmonary/Chest: Effort normal. No respiratory distress.  Musculoskeletal: Normal range of motion. He exhibits no edema.  Neurological: He is alert. No sensory deficit. Coordination normal.  Skin: Skin is warm and dry. Rash noted. He is not diaphoretic.  Areas of Scattered grouped vesicles and erythematous papules located to the left posterior thigh, posterior calf, medial ankle, patient is neurovascularly intact distally of bilateral lower extremities  Psychiatric: He has a normal mood and affect. His behavior is normal.  Nursing note and vitals reviewed.   ED Course  Procedures  DIAGNOSTIC STUDIES:  Oxygen Saturation is 99% on RA, normal by my interpretation.    COORDINATION OF CARE:  4:02 PM Will order pain meds.  Discussed treatment plan with pt at bedside and pt agreed to plan.  Labs Review Labs Reviewed - No data to display  Imaging Review No results found. I have personally reviewed and evaluated these images and lab results as part of my medical decision-making.   EKG Interpretation None      MDM   Final diagnoses:  Shingles  Rash    Patient with herpes zoster. Patient will be discharged with Valtrex and pain medication. No signs of secondary infection. No signs of disseminated herpes. Follow up with PCP in 2-3 days. Return  precautions discussed. Pt is safe for discharge at this time And expresses understanding to the discharge instructions.  Case discussed and patient seen by Dr. Manus Gunningancour who agrees with the above plan.  I personally performed the services described in this documentation, which was scribed in my presence. The recorded information has been reviewed and is accurate.       Jerre SimonJessica L Yussuf Sawyers, PA 02/11/16 1712  Glynn OctaveStephen Rancour, MD 02/11/16 [phone]

## 2016-02-11 NOTE — ED Notes (Signed)
Patient states he was bitten by insect oln Wednesday to left ankle. Now has small raised/blister area to back of knee, complains of pain with same

## 2016-02-11 NOTE — ED Notes (Signed)
J Focht, PA, in w/pt. 

## 2016-10-30 ENCOUNTER — Ambulatory Visit (HOSPITAL_COMMUNITY)
Admission: EM | Admit: 2016-10-30 | Discharge: 2016-10-30 | Disposition: A | Discharge status: Home / Self Care | Payer: BLUE CROSS/BLUE SHIELD | Attending: Family Medicine | Admitting: Family Medicine

## 2016-10-30 ENCOUNTER — Encounter (HOSPITAL_COMMUNITY): Payer: Self-pay | Admitting: *Deleted

## 2016-10-30 DIAGNOSIS — K047 Periapical abscess without sinus: Secondary | ICD-10-CM | POA: Diagnosis not present

## 2016-10-30 MED ORDER — CEPHALEXIN 500 MG PO CAPS: 500.0000 mg | ORAL_CAPSULE | Freq: Four times a day (QID) | ORAL | 0 refills | Status: DC

## 2016-10-30 NOTE — Discharge Instructions (Signed)
Call Yancey Flemings, dentist on [address] near [place], [phone]

## 2016-10-30 NOTE — ED Provider Notes (Signed)
[facility]    CSN: [id number] Arrival date & time: 10/30/16  1941     History   Chief Complaint Chief Complaint  Patient presents with  . Dental Injury    HPI Jose Schultz is a 51 y.o. male.   51 year old man comes in with 3 days of right lower jaw pain. He was gargling to relieve the pain on Saturday and a tooth #31 and surrounding tissue released to large amount of pus. He's had some pain since and the tooth is sensitive.      Past Medical History:  Diagnosis Date  . Chronic back pain    spondylolisthesis and radiculopathy  . Depression    hx of-per pt no meds now  . Hypertension   . Muscle spasm of back    takes Flexeril as needed  . Weakness    numbness and tingling both feet    Patient Active Problem List   Diagnosis Date Noted  . Spondylolisthesis of lumbar region 09/02/2013  . Depressive disorder, not elsewhere classified 10/08/2012  . Back pain 10/06/2012    Past Surgical History:  Procedure Laterality Date  . BACK SURGERY    . right 5th finger surgery     in high school       Home Medications    Prior to Admission medications   Medication Sig Start Date End Date Taking? Authorizing Provider  albuterol (PROVENTIL HFA;VENTOLIN HFA) 108 (90 BASE) MCG/ACT inhaler Inhale 1 puff into the lungs every 6 (six) hours as needed for wheezing or shortness of breath.    Historical Provider, MD  cephALEXin (KEFLEX) 500 MG capsule Take 1 capsule (500 mg total) by mouth 4 (four) times daily. 10/30/16   Elvina Sidle, MD  EPINEPHrine (EPIPEN 2-PAK) 0.3 mg/0.3 mL IJ SOAJ injection Inject 0.3 mLs (0.3 mg total) into the muscle once. 10/14/14   Mathis Fare Presson, PA  gabapentin (NEURONTIN) 600 MG tablet Take 1,200 mg by mouth 2 (two) times daily.    Historical Provider, MD  ibuprofen (ADVIL,MOTRIN) 800 MG tablet Take 1 tablet (800 mg total) by mouth 3 (three) times daily. 08/11/14   Graylon Good, PA-C  lisinopril-hydrochlorothiazide (PRINZIDE)  20-12.5 MG per tablet Take 1 tablet by mouth daily. 09/02/14   Rodolph Bong, MD  naproxen (NAPROSYN) 500 MG tablet Take 1 tablet (500 mg total) by mouth 2 (two) times daily. As needed for chest pain 05/06/15   Antony Madura, PA-C  oxyCODONE-acetaminophen (PERCOCET) 10-325 MG per tablet Take 1 tablet by mouth 2 (two) times daily as needed for pain. Patient taking differently: Take 1 tablet by mouth 4 (four) times daily.  08/13/14   Rodolph Bong, MD  predniSONE (DELTASONE) 20 MG tablet Take 2 tablets (40 mg total) by mouth daily. 05/06/15   Antony Madura, PA-C    Family History History reviewed. No pertinent family history.  Social History Social History  Substance Use Topics  . Smoking status: Current Every Day Smoker    Packs/day: 0.50    Years: 32.00    Types: Cigarettes  . Smokeless tobacco: Not on file  . Alcohol use No     Allergies   Penicillins; Doxycycline; Morphine and related; Bee venom; and Other   Review of Systems Review of Systems  HENT: Positive for dental problem.   All other systems reviewed and are negative.    Physical Exam Triage Vital Signs ED Triage Vitals  Enc Vitals Group     BP 10/30/16 1948  108/62     Pulse Rate 10/30/16 1948 93     Resp 10/30/16 1948 14     Temp 10/30/16 1948 99.1 F (37.3 C)     Temp Source 10/30/16 1948 Oral     SpO2 10/30/16 1948 98 %     Weight --      Height --      Head Circumference --      Peak Flow --      Pain Score 10/30/16 1950 6     Pain Loc --      Pain Edu? --      Excl. in GC? --    No data found.   Updated Vital Signs BP 108/62 (BP Location: Right Arm)   Pulse 93   Temp 99.1 F (37.3 C) (Oral)   Resp 14   SpO2 98%    Physical Exam  Constitutional: He is oriented to person, place, and time. He appears well-developed and well-nourished.  HENT:  Right Ear: External ear normal.  Left Ear: External ear normal.  Dental carie in tooth #31 surrounding gingival swelling and apparent evacuated abscess    Neck: Normal range of motion. Neck supple.  Pulmonary/Chest: Effort normal.  Musculoskeletal: Normal range of motion.  Lymphadenopathy:    He has no cervical adenopathy.  Neurological: He is alert and oriented to person, place, and time.  Skin: Skin is warm and dry.  Nursing note and vitals reviewed.    UC Treatments / Results  Labs (all labs ordered are listed, but only abnormal results are displayed) Labs Reviewed - No data to display  EKG  EKG Interpretation None       Radiology No results found.  Procedures Procedures (including critical care time)  Medications Ordered in UC Medications - No data to display   Initial Impression / Assessment and Plan / UC Course  I have reviewed the triage vital signs and the nursing notes.  Pertinent labs & imaging results that were available during my care of the patient were reviewed by me and considered in my medical decision making (see chart for details).     Final Clinical Impressions(s) / UC Diagnoses   Final diagnoses:  Dental abscess    New Prescriptions New Prescriptions   CEPHALEXIN (KEFLEX) 500 MG CAPSULE    Take 1 capsule (500 mg total) by mouth 4 (four) times daily.     Elvina Sidle, MD 10/30/16 2001

## 2016-10-30 NOTE — ED Triage Notes (Signed)
Pt  Reports  r  Lower  Molar  Broke  4  Days  Ago  He  Has  Pain  Present

## 2016-12-19 ENCOUNTER — Ambulatory Visit (HOSPITAL_COMMUNITY)
Admission: EM | Admit: 2016-12-19 | Discharge: 2016-12-19 | Disposition: A | Discharge status: Home / Self Care | Payer: BLUE CROSS/BLUE SHIELD | Attending: Family Medicine | Admitting: Family Medicine

## 2016-12-19 ENCOUNTER — Encounter (HOSPITAL_COMMUNITY): Payer: Self-pay | Admitting: Emergency Medicine

## 2016-12-19 DIAGNOSIS — K029 Dental caries, unspecified: Secondary | ICD-10-CM

## 2016-12-19 DIAGNOSIS — A09 Infectious gastroenteritis and colitis, unspecified: Secondary | ICD-10-CM

## 2016-12-19 MED ORDER — SULFAMETHOXAZOLE-TRIMETHOPRIM 800-160 MG PO TABS: 1.0000 | ORAL_TABLET | Freq: Two times a day (BID) | ORAL | 0 refills | Status: AC

## 2016-12-19 MED ORDER — HYOSCYAMINE SULFATE SL 0.125 MG SL SUBL: 1.0000 | SUBLINGUAL_TABLET | Freq: Three times a day (TID) | SUBLINGUAL | 0 refills | Status: DC | PRN

## 2016-12-19 NOTE — ED Triage Notes (Signed)
[facility] a [facility] of abdominal cramping with diarrhea that started yesterday.

## 2016-12-19 NOTE — Discharge Instructions (Signed)
Start probiotic three times a day for a week Retail buyer(Align or Culturelle)

## 2016-12-19 NOTE — ED Provider Notes (Signed)
[facility]    CSN: [id number] Arrival date & time: 12/19/16  1141     History   Chief Complaint Chief Complaint  Patient presents with  . Abdominal Cramping    HPI Jose Schultz is a 51 y.o. male.   [facility] a [facility] of abdominal cramping with diarrhea that started yesterday.  He was exposed to a niece with diarrhea and vomiting several days ago. He developed nausea, cramps, diarrhea yesterday and has continued today.  [place]  Patient also has continued problem with this tooth #32. It is broken and the oral surgeon plans on extracting the root on Friday.      Past Medical History:  Diagnosis Date  . Chronic back pain    spondylolisthesis and radiculopathy  . Depression    hx of-per pt no meds now  . Hypertension   . Muscle spasm of back    takes Flexeril as needed  . Weakness    numbness and tingling both feet    Patient Active Problem List   Diagnosis Date Noted  . Spondylolisthesis of lumbar region 09/02/2013  . Depressive disorder, not elsewhere classified 10/08/2012  . Back pain 10/06/2012    Past Surgical History:  Procedure Laterality Date  . BACK SURGERY    . right 5th finger surgery     in high school       Home Medications    Prior to Admission medications   Medication Sig Start Date End Date Taking? Authorizing Provider  gabapentin (NEURONTIN) 600 MG tablet Take 1,200 mg by mouth 2 (two) times daily.   Yes [provider]  ibuprofen (ADVIL,MOTRIN) 800 MG tablet Take 1 tablet (800 mg total) by mouth 3 (three) times daily. 08/11/14  Yes Baker, Adrian BlackwaterZachary H, PA-C  lisinopril-hydrochlorothiazide (PRINZIDE) 20-12.5 MG per tablet Take 1 tablet by mouth daily. 09/02/14  Yes Rodolph Bongorey, Evan S, MD  oxyCODONE-acetaminophen (PERCOCET) 10-325 MG per tablet Take 1 tablet by mouth 2 (two) times daily as needed for pain. Patient taking differently: Take 1 tablet by  mouth 4 (four) times daily.  08/13/14  Yes Rodolph Bongorey, Evan S, MD  ranitidine (ZANTAC) 150 MG tablet Take 150 mg by mouth 2 (two) times daily.   Yes [provider]  Hyoscyamine Sulfate SL (LEVSIN/SL) 0.125 MG SUBL Place 1 tablet under the tongue every 8 (eight) hours as needed. 12/19/16   Elvina SidleLauenstein, Exa Bomba, MD  sulfamethoxazole-trimethoprim (BACTRIM DS,SEPTRA DS) 800-160 MG tablet Take 1 tablet by mouth 2 (two) times daily. 12/19/16 12/26/16  Elvina SidleLauenstein, Leea Rambeau, MD    Family History History reviewed. No pertinent family history.  Social History Social History  Substance Use Topics  . Smoking status: Current Every Day Smoker    Packs/day: 0.50    Years: 32.00    Types: Cigarettes  . Smokeless tobacco: Not on file  . Alcohol use No     Allergies   Penicillins; Doxycycline; Morphine and related; Bee venom; and Other   Review of Systems Review of Systems  Constitutional: Negative.   HENT: Positive for dental problem.   Gastrointestinal: Positive for diarrhea and nausea.  Neurological: Negative for dizziness.     Physical Exam Triage Vital Signs ED Triage Vitals  Enc Vitals Group     BP 12/19/16 1234 118/78     Pulse Rate 12/19/16 1234 85     Resp 12/19/16 1234 18     Temp 12/19/16 1234 98 F (  36.7 C)     Temp Source 12/19/16 1234 Oral     SpO2 12/19/16 1234 100 %     Weight --      Height --      Head Circumference --      Peak Flow --      Pain Score 12/19/16 1232 6     Pain Loc --      Pain Edu? --      Excl. in GC? --    No data found.   Updated Vital Signs BP 118/78 (BP Location: Right Arm)   Pulse 85   Temp 98 F (36.7 C) (Oral)   Resp 18   SpO2 100%   Visual Acuity Right Eye Distance:   Left Eye Distance:   Bilateral Distance:    Right Eye Near:   Left Eye Near:    Bilateral Near:     Physical Exam  Constitutional: He is oriented to person, place, and time. He appears well-developed and well-nourished.  HENT:  Right Ear: External ear  normal.  Left Ear: External ear normal.  Inflamed gum at tooth #32 which is fragmented  Eyes: Conjunctivae are normal. Pupils are equal, round, and reactive to light.  Neck: Normal range of motion. Neck supple.  Pulmonary/Chest: Effort normal.  Abdominal: Soft. He exhibits no mass. There is no tenderness. There is no rebound and no guarding.  Musculoskeletal: Normal range of motion.  Neurological: He is alert and oriented to person, place, and time.  Skin: Skin is warm and dry.  Nursing note and vitals reviewed.    UC Treatments / Results  Labs (all labs ordered are listed, but only abnormal results are displayed) Labs Reviewed - No data to display  EKG  EKG Interpretation None       Radiology No results found.  Procedures Procedures (including critical care time)  Medications Ordered in UC Medications - No data to display   Initial Impression / Assessment and Plan / UC Course  I have reviewed the triage vital signs and the nursing notes.  Pertinent labs & imaging results that were available during my care of the patient were reviewed by me and considered in my medical decision making (see chart for details).     Final Clinical Impressions(s) / UC Diagnoses   Final diagnoses:  Diarrhea of infectious origin  Dental caries    New Prescriptions New Prescriptions   HYOSCYAMINE SULFATE SL (LEVSIN/SL) 0.125 MG SUBL    Place 1 tablet under the tongue every 8 (eight) hours as needed.   SULFAMETHOXAZOLE-TRIMETHOPRIM (BACTRIM DS,SEPTRA DS) 800-160 MG TABLET    Take 1 tablet by mouth 2 (two) times daily.     Elvina Sidle, MD 12/19/16 1308

## 2016-12-19 NOTE — ED Notes (Signed)
Patient called to triage from lobby..no answer

## 2017-01-02 ENCOUNTER — Encounter (HOSPITAL_COMMUNITY): Payer: Self-pay | Admitting: Emergency Medicine

## 2017-01-02 ENCOUNTER — Ambulatory Visit (INDEPENDENT_AMBULATORY_CARE_PROVIDER_SITE_OTHER): Payer: BLUE CROSS/BLUE SHIELD

## 2017-01-02 ENCOUNTER — Ambulatory Visit (HOSPITAL_COMMUNITY)
Admission: EM | Admit: 2017-01-02 | Discharge: 2017-01-02 | Disposition: A | Discharge status: Home / Self Care | Payer: BLUE CROSS/BLUE SHIELD | Attending: Internal Medicine | Admitting: Internal Medicine

## 2017-01-02 DIAGNOSIS — J181 Lobar pneumonia, unspecified organism: Secondary | ICD-10-CM | POA: Diagnosis not present

## 2017-01-02 DIAGNOSIS — J9801 Acute bronchospasm: Secondary | ICD-10-CM

## 2017-01-02 DIAGNOSIS — R509 Fever, unspecified: Secondary | ICD-10-CM | POA: Diagnosis not present

## 2017-01-02 DIAGNOSIS — Z720 Tobacco use: Secondary | ICD-10-CM

## 2017-01-02 DIAGNOSIS — J189 Pneumonia, unspecified organism: Secondary | ICD-10-CM

## 2017-01-02 LAB — POCT URINALYSIS DIP (DEVICE)
Bilirubin Urine: NEGATIVE
Glucose, UA: NEGATIVE mg/dL
Hgb urine dipstick: NEGATIVE
KETONES UR: NEGATIVE mg/dL
Leukocytes, UA: NEGATIVE
Nitrite: NEGATIVE
PROTEIN: 30 mg/dL — AB
SPECIFIC GRAVITY, URINE: 1.015 (ref 1.005–1.030)
Urobilinogen, UA: 4 mg/dL — ABNORMAL HIGH (ref 0.0–1.0)
pH: 6 (ref 5.0–8.0)

## 2017-01-02 MED ORDER — AZITHROMYCIN 250 MG PO TABS: ORAL_TABLET | ORAL | 0 refills | Status: DC

## 2017-01-02 MED ORDER — ACETAMINOPHEN 325 MG PO TABS
650.0000 mg | ORAL_TABLET | Freq: Once | ORAL | Status: AC
  Administered 2017-01-02: 975 mg via ORAL

## 2017-01-02 MED ORDER — ACETAMINOPHEN 325 MG PO TABS
ORAL_TABLET | ORAL | Status: AC
  Filled 2017-01-02: qty 3

## 2017-01-02 MED ORDER — ALBUTEROL SULFATE HFA 108 (90 BASE) MCG/ACT IN AERS: 2.0000 | INHALATION_SPRAY | RESPIRATORY_TRACT | 0 refills | Status: AC | PRN

## 2017-01-02 MED ORDER — CEFDINIR 300 MG PO CAPS: 300.0000 mg | ORAL_CAPSULE | Freq: Two times a day (BID) | ORAL | 0 refills | Status: DC

## 2017-01-02 NOTE — ED Provider Notes (Signed)
CSN: [id number]     Arrival date & time 01/02/17  1929 History   None    Chief Complaint  Patient presents with  . Fever   (Consider location/radiation/quality/duration/timing/severity/associated sxs/prior Treatment) HPI  Past Medical History:  Diagnosis Date  . Chronic back pain    spondylolisthesis and radiculopathy  . Depression    hx of-per pt no meds now  . Hypertension   . Muscle spasm of back    takes Flexeril as needed  . Weakness    numbness and tingling both feet   Past Surgical History:  Procedure Laterality Date  . BACK SURGERY    . right 5th finger surgery     in high school   History reviewed. No pertinent family history. Social History  Substance Use Topics  . Smoking status: Current Every Day Smoker    Packs/day: 0.50    Years: 32.00    Types: Cigarettes  . Smokeless tobacco: Not on file  . Alcohol use No    Review of Systems  Allergies  Penicillins; Doxycycline; Morphine and related; Bee venom; and Other  Home Medications   Prior to Admission medications   Medication Sig Start Date End Date Taking? Authorizing Provider  albuterol (PROVENTIL HFA;VENTOLIN HFA) 108 (90 Base) MCG/ACT inhaler Inhale 2 puffs into the lungs every 4 (four) hours as needed for wheezing or shortness of breath. 01/02/17   Hayden Rasmussen, NP  azithromycin (ZITHROMAX) 250 MG tablet 2 tabs po on day one, then one tablet po once daily on days 2-5. 01/02/17   Hayden Rasmussen, NP  cefdinir (OMNICEF) 300 MG capsule Take 1 capsule (300 mg total) by mouth 2 (two) times daily. 01/02/17   Hayden Rasmussen, NP  gabapentin (NEURONTIN) 600 MG tablet Take 1,200 mg by mouth 2 (two) times daily.    [provider]  Hyoscyamine Sulfate SL (LEVSIN/SL) 0.125 MG SUBL Place 1 tablet under the tongue every 8 (eight) hours as needed. 12/19/16   Elvina Sidle, MD  ibuprofen (ADVIL,MOTRIN) 800 MG tablet Take 1 tablet (800 mg total) by mouth 3 (three) times daily. 08/11/14   Baker, Adrian Blackwater, PA-C   lisinopril-hydrochlorothiazide (PRINZIDE) 20-12.5 MG per tablet Take 1 tablet by mouth daily. 09/02/14   Rodolph Bong, MD  oxyCODONE-acetaminophen (PERCOCET) 10-325 MG per tablet Take 1 tablet by mouth 2 (two) times daily as needed for pain. Patient taking differently: Take 1 tablet by mouth 4 (four) times daily.  08/13/14   Rodolph Bong, MD  ranitidine (ZANTAC) 150 MG tablet Take 150 mg by mouth 2 (two) times daily.    [provider]   Meds Ordered and Administered this Visit   Medications  acetaminophen (TYLENOL) tablet 650 mg (975 mg Oral Given 01/02/17 2022)    BP 127/72 (BP Location: Right Arm)   Pulse (!) 114   Temp (!) 103.1 F (39.5 C) (Oral)   Resp (!) 26   SpO2 100%  No data found.   Physical Exam  Urgent Care Course     Procedures (including critical care time)  Labs Review Labs Reviewed  POCT URINALYSIS DIP (DEVICE) - Abnormal; Notable for the following:       Result Value   Protein, ur 30 (*)    Urobilinogen, UA 4.0 (*)    All other components within normal limits    Imaging Review Dg Chest 2 View  Result Date: 01/02/2017 CLINICAL DATA:  Fever, chills, and diarrhea x3 days. EXAM: CHEST  2 VIEW COMPARISON:  05/06/2015  CXR FINDINGS: The heart size and mediastinal contours are within normal limits. Subtle increase in pulmonary opacity in the right middle lobe distribution may reflect stigmata of a right middle lobe pneumonia and/or atelectasis. No effusion or CHF. The visualized skeletal structures are unremarkable. IMPRESSION: Subtle airspace opacities in the right middle lobe distribution suspicious for stigmata of right middle lobe pneumonia and/or atelectasis. Electronically Signed   By: Tollie Ethavid  Kwon M.D.   On: 01/02/2017 21:04     Visual Acuity Review  Right Eye Distance:   Left Eye Distance:   Bilateral Distance:    Right Eye Near:   Left Eye Near:    Bilateral Near:         MDM   1. Pneumonia of right middle lobe due to infectious  organism (HCC)   2. Bronchospasm   3. Tobacco abuse disorder   4. Febrile illness    The albuterol inhaler 2 puffs every 4 hours for cough and wheeze. The x-ray suggests that you have a pneumonia in the right middle lobe. You are prescribed antibiotics to take for this. For fever take Tylenol every 4 hours. Drink plenty of fluids stay well-hydrated. Stop smoking. No work for the next 2 days minimum. Meds ordered this encounter  Medications  . acetaminophen (TYLENOL) tablet 650 mg  . albuterol (PROVENTIL HFA;VENTOLIN HFA) 108 (90 Base) MCG/ACT inhaler    Sig: Inhale 2 puffs into the lungs every 4 (four) hours as needed for wheezing or shortness of breath.    Dispense:  1 Inhaler    Refill:  0    Order Specific Question:   Supervising Provider    Answer:   Eustace MooreMURRAY, LAURA W [[id number]]  . azithromycin (ZITHROMAX) 250 MG tablet    Sig: 2 tabs po on day one, then one tablet po once daily on days 2-5.    Dispense:  6 tablet    Refill:  0    Order Specific Question:   Supervising Provider    Answer:   Eustace MooreMURRAY, LAURA W [[id number]]  . cefdinir (OMNICEF) 300 MG capsule    Sig: Take 1 capsule (300 mg total) by mouth 2 (two) times daily.    Dispense:  14 capsule    Refill:  0    Order Specific Question:   Supervising Provider    Answer:   Eustace MooreMURRAY, LAURA W [[id number]]       Hayden RasmussenMabe, Sharde Gover, NP 01/02/17 2120

## 2017-01-02 NOTE — ED Triage Notes (Signed)
Patient reports symptoms started 3 days ago.  Patient having fever, chills, general aches.  Patient reports diarrhea, no vomiting

## 2017-01-02 NOTE — ED Notes (Signed)
Urine specimen obtained and in the lab

## 2017-01-02 NOTE — Discharge Instructions (Signed)
The albuterol inhaler 2 puffs every 4 hours for cough and wheeze. The x-ray suggests that you have a pneumonia in the right middle lobe. You are prescribed antibiotics to take for this. For fever take Tylenol every 4 hours. Drink plenty of fluids stay well-hydrated. Stop smoking. No work for the next 2 days minimum.

## 2017-10-15 ENCOUNTER — Emergency Department (HOSPITAL_COMMUNITY)
Admission: EM | Admit: 2017-10-15 | Discharge: 2017-10-15 | Disposition: A | Discharge status: Home / Self Care | Payer: BLUE CROSS/BLUE SHIELD | Attending: Emergency Medicine | Admitting: Emergency Medicine

## 2017-10-15 ENCOUNTER — Encounter (HOSPITAL_COMMUNITY): Payer: Self-pay | Admitting: Emergency Medicine

## 2017-10-15 ENCOUNTER — Other Ambulatory Visit: Payer: Self-pay

## 2017-10-15 DIAGNOSIS — Z5321 Procedure and treatment not carried out due to patient leaving prior to being seen by health care provider: Secondary | ICD-10-CM | POA: Diagnosis not present

## 2017-10-15 DIAGNOSIS — R112 Nausea with vomiting, unspecified: Secondary | ICD-10-CM | POA: Insufficient documentation

## 2017-10-15 LAB — COMPREHENSIVE METABOLIC PANEL
ALT: 18 U/L (ref 17–63)
AST: 25 U/L (ref 15–41)
Albumin: 3.6 g/dL (ref 3.5–5.0)
Alkaline Phosphatase: 75 U/L (ref 38–126)
Anion gap: 8 (ref 5–15)
BUN: 14 mg/dL (ref 6–20)
CHLORIDE: 106 mmol/L (ref 101–111)
CO2: 22 mmol/L (ref 22–32)
Calcium: 8.7 mg/dL — ABNORMAL LOW (ref 8.9–10.3)
Creatinine, Ser: 1.25 mg/dL — ABNORMAL HIGH (ref 0.61–1.24)
Glucose, Bld: 109 mg/dL — ABNORMAL HIGH (ref 65–99)
POTASSIUM: 3.8 mmol/L (ref 3.5–5.1)
Sodium: 136 mmol/L (ref 135–145)
TOTAL PROTEIN: 6 g/dL — AB (ref 6.5–8.1)
Total Bilirubin: 1 mg/dL (ref 0.3–1.2)

## 2017-10-15 LAB — CBC
HEMATOCRIT: 46.3 % (ref 39.0–52.0)
Hemoglobin: 16.6 g/dL (ref 13.0–17.0)
MCH: 32 pg (ref 26.0–34.0)
MCHC: 35.9 g/dL (ref 30.0–36.0)
MCV: 89.2 fL (ref 78.0–100.0)
PLATELETS: 173 10*3/uL (ref 150–400)
RBC: 5.19 MIL/uL (ref 4.22–5.81)
RDW: 12.7 % (ref 11.5–15.5)
WBC: 7.5 10*3/uL (ref 4.0–10.5)

## 2017-10-15 LAB — LIPASE, BLOOD: LIPASE: 26 U/L (ref 11–51)

## 2017-10-15 MED ORDER — ONDANSETRON 4 MG PO TBDP
4.0000 mg | ORAL_TABLET | Freq: Once | ORAL | Status: AC | PRN
  Administered 2017-10-15: 4 mg via ORAL
  Filled 2017-10-15: qty 1

## 2017-10-15 NOTE — ED Triage Notes (Signed)
Pt reports n/v/d since 1830 yesterday. Emesis x 8, diarrhea x 8 per patient.

## 2017-10-15 NOTE — ED Notes (Signed)
No answer when called 

## 2017-11-02 ENCOUNTER — Other Ambulatory Visit: Payer: Self-pay

## 2017-11-02 ENCOUNTER — Emergency Department (HOSPITAL_COMMUNITY): Payer: BLUE CROSS/BLUE SHIELD

## 2017-11-02 ENCOUNTER — Encounter (HOSPITAL_COMMUNITY): Payer: Self-pay

## 2017-11-02 ENCOUNTER — Inpatient Hospital Stay (HOSPITAL_COMMUNITY)
Admission: EM | Admit: 2017-11-02 | Discharge: 2017-11-05 | DRG: 247 | Disposition: A | Discharge status: Home / Self Care | Payer: BLUE CROSS/BLUE SHIELD | Attending: Student in an Organized Health Care Education/Training Program | Admitting: Student in an Organized Health Care Education/Training Program

## 2017-11-02 DIAGNOSIS — K219 Gastro-esophageal reflux disease without esophagitis: Secondary | ICD-10-CM | POA: Diagnosis present

## 2017-11-02 DIAGNOSIS — I340 Nonrheumatic mitral (valve) insufficiency: Secondary | ICD-10-CM | POA: Diagnosis present

## 2017-11-02 DIAGNOSIS — Z79899 Other long term (current) drug therapy: Secondary | ICD-10-CM | POA: Diagnosis not present

## 2017-11-02 DIAGNOSIS — E876 Hypokalemia: Secondary | ICD-10-CM | POA: Diagnosis present

## 2017-11-02 DIAGNOSIS — R072 Precordial pain: Secondary | ICD-10-CM | POA: Diagnosis not present

## 2017-11-02 DIAGNOSIS — I10 Essential (primary) hypertension: Secondary | ICD-10-CM | POA: Diagnosis present

## 2017-11-02 DIAGNOSIS — Z955 Presence of coronary angioplasty implant and graft: Secondary | ICD-10-CM

## 2017-11-02 DIAGNOSIS — F17210 Nicotine dependence, cigarettes, uncomplicated: Secondary | ICD-10-CM | POA: Diagnosis present

## 2017-11-02 DIAGNOSIS — I214 Non-ST elevation (NSTEMI) myocardial infarction: Secondary | ICD-10-CM | POA: Diagnosis present

## 2017-11-02 DIAGNOSIS — I200 Unstable angina: Secondary | ICD-10-CM | POA: Diagnosis not present

## 2017-11-02 DIAGNOSIS — R079 Chest pain, unspecified: Secondary | ICD-10-CM | POA: Insufficient documentation

## 2017-11-02 DIAGNOSIS — R55 Syncope and collapse: Secondary | ICD-10-CM | POA: Diagnosis present

## 2017-11-02 DIAGNOSIS — M549 Dorsalgia, unspecified: Secondary | ICD-10-CM | POA: Diagnosis present

## 2017-11-02 DIAGNOSIS — Z79891 Long term (current) use of opiate analgesic: Secondary | ICD-10-CM | POA: Diagnosis not present

## 2017-11-02 DIAGNOSIS — N179 Acute kidney failure, unspecified: Secondary | ICD-10-CM | POA: Diagnosis present

## 2017-11-02 DIAGNOSIS — I213 ST elevation (STEMI) myocardial infarction of unspecified site: Secondary | ICD-10-CM | POA: Diagnosis present

## 2017-11-02 DIAGNOSIS — F339 Major depressive disorder, recurrent, unspecified: Secondary | ICD-10-CM | POA: Diagnosis not present

## 2017-11-02 DIAGNOSIS — Z95820 Peripheral vascular angioplasty status with implants and grafts: Secondary | ICD-10-CM | POA: Diagnosis not present

## 2017-11-02 DIAGNOSIS — G8929 Other chronic pain: Secondary | ICD-10-CM | POA: Diagnosis present

## 2017-11-02 DIAGNOSIS — I2510 Atherosclerotic heart disease of native coronary artery without angina pectoris: Secondary | ICD-10-CM | POA: Diagnosis not present

## 2017-11-02 DIAGNOSIS — I249 Acute ischemic heart disease, unspecified: Secondary | ICD-10-CM

## 2017-11-02 LAB — RAPID URINE DRUG SCREEN, HOSP PERFORMED
Amphetamines: NOT DETECTED
BARBITURATES: NOT DETECTED
Benzodiazepines: NOT DETECTED
Cocaine: NOT DETECTED
Opiates: NOT DETECTED
TETRAHYDROCANNABINOL: NOT DETECTED

## 2017-11-02 LAB — BASIC METABOLIC PANEL
Anion gap: 8 (ref 5–15)
BUN: 10 mg/dL (ref 6–20)
CO2: 23 mmol/L (ref 22–32)
CREATININE: 1.38 mg/dL — AB (ref 0.61–1.24)
Calcium: 8.3 mg/dL — ABNORMAL LOW (ref 8.9–10.3)
Chloride: 106 mmol/L (ref 101–111)
GFR calc Af Amer: >60 mL/min (ref >60)
GFR, EST NON AFRICAN AMERICAN: 59 mL/min — AB (ref >60)
Glucose, Bld: 96 mg/dL (ref 65–99)
POTASSIUM: 3.4 mmol/L — AB (ref 3.5–5.1)
Sodium: 137 mmol/L (ref 135–145)

## 2017-11-02 LAB — CBC
HCT: 42.3 % (ref 39.0–52.0)
Hemoglobin: 15 g/dL (ref 13.0–17.0)
MCH: 31.4 pg (ref 26.0–34.0)
MCHC: 35.5 g/dL (ref 30.0–36.0)
MCV: 88.5 fL (ref 78.0–100.0)
PLATELETS: 192 10*3/uL (ref 150–400)
RBC: 4.78 MIL/uL (ref 4.22–5.81)
RDW: 13.1 % (ref 11.5–15.5)
WBC: 6.3 10*3/uL (ref 4.0–10.5)

## 2017-11-02 LAB — I-STAT TROPONIN, ED
Troponin i, poc: 0 ng/mL (ref 0.00–0.08)
Troponin i, poc: 0.19 ng/mL (ref 0.00–0.08)

## 2017-11-02 LAB — TROPONIN I: TROPONIN I: 0.5 ng/mL — AB (ref <0.03)

## 2017-11-02 LAB — D-DIMER, QUANTITATIVE (NOT AT ARMC)

## 2017-11-02 MED ORDER — ASPIRIN 81 MG PO CHEW
324.0000 mg | CHEWABLE_TABLET | Freq: Once | ORAL | Status: AC
  Administered 2017-11-02: 324 mg via ORAL
  Filled 2017-11-02: qty 4

## 2017-11-02 MED ORDER — OXYCODONE-ACETAMINOPHEN 10-325 MG PO TABS: 1.0000 | ORAL_TABLET | Freq: Four times a day (QID) | ORAL | Status: DC | PRN

## 2017-11-02 MED ORDER — HEPARIN (PORCINE) IN NACL 100-0.45 UNIT/ML-% IJ SOLN
1350.0000 [IU]/h | INTRAMUSCULAR | Status: DC
  Administered 2017-11-02: 1100 [IU]/h via INTRAVENOUS
  Administered 2017-11-03: 1250 [IU]/h via INTRAVENOUS
  Filled 2017-11-02 (×2): qty 250

## 2017-11-02 MED ORDER — SENNOSIDES-DOCUSATE SODIUM 8.6-50 MG PO TABS: 1.0000 | ORAL_TABLET | Freq: Every evening | ORAL | Status: DC | PRN

## 2017-11-02 MED ORDER — NITROGLYCERIN 0.4 MG SL SUBL
0.4000 mg | SUBLINGUAL_TABLET | SUBLINGUAL | Status: DC | PRN
  Administered 2017-11-02: 0.4 mg via SUBLINGUAL
  Filled 2017-11-02: qty 1

## 2017-11-02 MED ORDER — OXYCODONE HCL 5 MG PO TABS
5.0000 mg | ORAL_TABLET | Freq: Four times a day (QID) | ORAL | Status: DC | PRN
  Administered 2017-11-03 – 2017-11-05 (×6): 5 mg via ORAL
  Filled 2017-11-02 (×7): qty 1

## 2017-11-02 MED ORDER — OXYCODONE-ACETAMINOPHEN 5-325 MG PO TABS
1.0000 | ORAL_TABLET | Freq: Four times a day (QID) | ORAL | Status: DC | PRN
Start: 2017-11-02 — End: 2017-11-05
  Administered 2017-11-03 – 2017-11-05 (×6): 1 via ORAL
  Filled 2017-11-02 (×6): qty 1

## 2017-11-02 MED ORDER — ACETAMINOPHEN 650 MG RE SUPP: 650.0000 mg | Freq: Four times a day (QID) | RECTAL | Status: DC | PRN

## 2017-11-02 MED ORDER — GABAPENTIN 600 MG PO TABS
1200.0000 mg | ORAL_TABLET | Freq: Two times a day (BID) | ORAL | Status: DC
  Administered 2017-11-03 – 2017-11-05 (×5): 1200 mg via ORAL
  Filled 2017-11-02 (×5): qty 2

## 2017-11-02 MED ORDER — HEPARIN BOLUS VIA INFUSION
4000.0000 [IU] | Freq: Once | INTRAVENOUS | Status: AC
Start: 2017-11-02 — End: 2017-11-02
  Administered 2017-11-02: 4000 [IU] via INTRAVENOUS
  Filled 2017-11-02: qty 4000

## 2017-11-02 MED ORDER — FAMOTIDINE 20 MG PO TABS
20.0000 mg | ORAL_TABLET | Freq: Every day | ORAL | Status: DC | PRN
Start: 2017-11-02 — End: 2017-11-05

## 2017-11-02 MED ORDER — ACETAMINOPHEN 325 MG PO TABS
650.0000 mg | ORAL_TABLET | Freq: Four times a day (QID) | ORAL | Status: DC | PRN
  Administered 2017-11-03 – 2017-11-04 (×2): 650 mg via ORAL
  Filled 2017-11-02 (×2): qty 2

## 2017-11-02 NOTE — ED Notes (Signed)
Pt reports the 1 nitro seemed to "make it easier to breath."

## 2017-11-02 NOTE — ED Triage Notes (Addendum)
Patient reports that he got in argument and developed CP 10 minutes pta. Patient per tech hyperventilating at registration and fell onto floor, states that he has headache, no loc, no obvious injury. Patient appears anxious and continuing to hyperventilate. Patient states that the pain was so bad that it made him fall hitting head on wall. Appears upset during triage. No abrasions, no laceration, no hematoma noted to scalp. MD Charm BargesButler notified of fall

## 2017-11-02 NOTE — H&P (Addendum)
Date: 11/02/2017               Patient Name:  Jose Schultz MRN: [id number]  DOB: 1966-03-04 Age / Sex: 52 y.o., male   PCP: [facility]         Medical Service: Internal Medicine Teaching Service         Attending Physician: Dr. Earl LagosNarendra, Nischal, MD    First Contact: Dr. Alinda MoneyMelvin Pager: [phone]  Second Contact: Dr. Obie DredgeBlum Pager: [phone]       After Hours (After 5p/  First Contact Pager: [phone]  weekends / holidays): Second Contact Pager: [phone]   Chief Complaint: chest pain  History of Present Illness:  Mr. Jose FetchRedmond is a 52yo male with PMH of HTN, GERD, depression, and chronic back pain who presents with chest pain.  He reports he was in an argument with his girlfriend earlier today when he started feeling dizzy and developed left-sided non-radiating burning chest pain. The pain is intermittent, pleuritic, and associated with SOB. Denies associated nausea or diaphoresis. He came directly to the ER. While checking in at the front desk, he experienced a feeling of "heart racing", dizziness, and like he was going to pass out. He reportedly lost consciousness. He states he did hit his head but denies current pain on his head. Denies biting his tongue or urinary incontinence. The chest pain was relieved by SL NTG. At the time of my interview, he reported the chest pain was returning although not as severe. He has never experienced this chest pain before. He has never had an echo, stress test, or cath before. No known family history of MI. He does have acid reflux, however he reports that this typically feels like burning in his throat and that this chest pain was very different.  He smokes ~1/2ppd. Denies alcohol use or current illicit drug use. 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 Lives at home alone.  ED Course: - BP 155/93, HR 98, RR 28, temp 98, O2 100% on RA - CBC wnl. BMP with K 3.4, Cr 1.38. Trop 0.0 -> 0.19. D-dimer negative - CXR without  acute cardiopulmonary disease. EKG with tachycardia (103), T wave inversion in lead III, no ST elevation. - Received aspirin 324mg  and SL NTG x1 - Cardiology consulted by EDP, starting IV heparin.  Meds:  Current Meds  Medication Sig  . albuterol (PROVENTIL HFA;VENTOLIN HFA) 108 (90 Base) MCG/ACT inhaler Inhale 2 puffs into the lungs every 4 (four) hours as needed for wheezing or shortness of breath.  . gabapentin (NEURONTIN) 600 MG tablet Take 1,200 mg by mouth 2 (two) times daily.  Marland Kitchen. ibuprofen (ADVIL,MOTRIN) 800 MG tablet Take 1 tablet (800 mg total) by mouth 3 (three) times daily.  Marland Kitchen. lisinopril-hydrochlorothiazide (PRINZIDE) 20-12.5 MG per tablet Take 1 tablet by mouth daily.  Marland Kitchen. oxyCODONE-acetaminophen (PERCOCET) 10-325 MG per tablet Take 1 tablet by mouth 2 (two) times daily as needed for pain. (Patient taking differently: Take 1 tablet by mouth 4 (four) times daily. )  . ranitidine (ZANTAC) 150 MG tablet Take 150 mg by mouth 2 (two) times daily.  Marland Kitchen. tiZANidine (ZANAFLEX) 4 MG tablet Take 4 mg by mouth 2 (two) times daily.   Allergies: Allergies as of 11/02/2017 - Review Complete 11/02/2017  Allergen Reaction Noted  . Penicillins Diarrhea 08/14/2011  . Doxycycline  05/06/2015  . Morphine and related Other (See Comments) 05/06/2015  . Bee venom Other (See Comments) 10/05/2012  .  Other Other (See Comments) 08/28/2013   Past Medical History:  Diagnosis Date  . Chronic back pain    spondylolisthesis and radiculopathy  . Depression    hx of-per pt no meds now  . Hypertension   . Muscle spasm of back    takes Flexeril as needed  . Weakness    numbness and tingling both feet   Family History:  - No known family history of MI  Social History:  - smokes ~1/2ppd, interested in quitting - denies alcohol use or current illicit drug use (reports being abstinent from crack cocaine for the last 5 years) - lives at home alone  Review of Systems: A complete ROS was negative except as  per HPI.  Physical Exam: Blood pressure 128/90, pulse 76, temperature 98 F (36.7 C), temperature source Oral, resp. rate 13, weight 196 lb 3.4 oz (89 kg), SpO2 100 %.  GEN: Well-appearing, young male lying in bed. Alert and oriented. No acute distress. HENT: [place]/AT. Moist mucous membranes. No visible lesions. EYES: PERRL. Sclera non-icteric. Conjunctiva erythematous. RESP: Clear to auscultation bilaterally. No wheezes, rales, or rhonchi. No increased work of breathing. CV: Normal rate and regular rhythm. No murmurs, gallops, or rubs. No carotid bruits. No LE edema. No chest wall tenderness. ABD: Soft. Non-tender. Non-distended. Normoactive bowel sounds. EXT: No edema. Warm and well perfused. NEURO: Cranial nerves II-XII grossly intact. Able to lift all four extremities against gravity. No apparent audiovisual hallucinations. Speech fluent and appropriate. PSYCH: Patient is calm and pleasant. Appropriate affect. Well-groomed; speech is appropriate and on-subject.  Labs CBC Latest Ref Rng & Units 11/02/2017 10/15/2017 05/06/2015  WBC 4.0 - 10.5 K/uL 6.3 7.5 3.7(L)  Hemoglobin 13.0 - 17.0 g/dL 16.1 09.6 04.5  Hematocrit 39.0 - 52.0 % 42.3 46.3 41.4  Platelets 150 - 400 K/uL 192 173 306   CMP Latest Ref Rng & Units 11/02/2017 10/15/2017 05/06/2015  Glucose 65 - 99 mg/dL 96 409(W) 119(J)  BUN 6 - 20 mg/dL 10 14 7   Creatinine 0.61 - 1.24 mg/dL 4.78(G) 9.56(O) 1.30(Q)  Sodium 135 - 145 mmol/L 137 136 138  Potassium 3.5 - 5.1 mmol/L 3.4(L) 3.8 3.7  Chloride 101 - 111 mmol/L 106 106 103  CO2 22 - 32 mmol/L 23 22 25   Calcium 8.9 - 10.3 mg/dL 8.3(L) 8.7(L) 9.3  Total Protein 6.5 - 8.1 g/dL - 6.0(L) -  Total Bilirubin 0.3 - 1.2 mg/dL - 1.0 -  Alkaline Phos 38 - 126 U/L - 75 -  AST 15 - 41 U/L - 25 -  ALT 17 - 63 U/L - 18 -   D-dimer negative Troponin 0.00 -> 0.19  EKG: personally reviewed my interpretation is tachycardic (HR 103), normal intervals, new T wave inversion in lead III, no ST  elevation  CXR: personally reviewed my interpretation is no evidence of consolidation or pleural effusions. Normal heart size  Assessment & Plan by Problem: Active Problems:   Chest pain  Mr. Scully is a 52yo male with PMH of HTN, GERD, depression, and chronic back pain who presents with chest pain, found to have elevated troponin and EKG with new T wave inversion in lead III, consistent with NSTEMI. Cardiology has been consulted, recommending initiation of IV heparin drip.  NSTEMI Presents with left-sided chest pain with typical features, provoked by emotional stress and relieved by NTG. Troponin 0.00 -> 0.19 with new T wave inversion in lead III but no ST elevation. UDS negative. D-dimer negative. Loaded with aspirin and IV heparin  drip started, per Cardiology recs. - Cardiology consulted; appreciate their assistance - Admit to telemetry - Continue IV heparin drip - Aspirin 81mg  daily - SL NTG 0.4mg  q74min PRN for chest pain - Trend troponins - EKG in AM - TTE - CBC, CMP in AM - HH diet, NPO at [place] - A1c, lipid panel  Syncope Unclear etiology, although could be related to ACS. No s/s to suggest seizure. No carotid bruits. He does report pre-syncopal symptoms - could be vasovagal/situational.  - Telemetry - Management for ACS, as above - TTE  Renal dysfunction Cr 1.38 on admission (previous values range from 1.2-1.5), unclear whether this is chronic or acute. - CMP in AM  HTN Home regimen includes lisinopril-HCTZ 20-12.5mg  daily. BP 128/90 - Holding home lisinopril-HCTZ given normotension, can restart if BP elevated  Chronic back pain On Percocet 10-325mg  q6h PRN, prescribed by [facility], as well as tizanidine 4mg  QHS, and gabapentin. - Continue home gabapentin 1200mg  BID - Continue home Percocet 10-325mg  q6h PRN  GERD Home regimen includes ranitidine 150mg  BID - famotidine 20mg  daily PRN for heartburn, indigestion  Diet: HH, NPO at MN VTE PPx: IV heparin  gtt Code Status: Full code Dispo: Admit patient to Inpatient with expected length of stay greater than 2 midnights.  Signed: Scherrie Gerlach, MD 11/02/2017, 10:45 PM  Pager: Demetrius Charity [phone]

## 2017-11-02 NOTE — ED Notes (Signed)
Informed pt that a person who identifies herself as his GF called and wanted to talk to him.  She was told that pt would be given the message and he would be given the opportunity to call her back.  Pt chose not to return the call.

## 2017-11-02 NOTE — Progress Notes (Signed)
ANTICOAGULATION CONSULT NOTE - Initial Consult  Pharmacy Consult for heparin Indication: chest pain/ACS  Allergies  Allergen Reactions  . Penicillins Diarrhea    unknown  . Doxycycline     Rash, hives, bumps   . Morphine And Related Other (See Comments)    siezures  . Bee Venom Other (See Comments)  . Other Other (See Comments)    [place]    Patient Measurements: Heparin Dosing Weight: 86 kg  Vital Signs: Temp: 98 F (36.7 C) (04/06 1744) Temp Source: Oral (04/06 1744) BP: 128/90 (04/06 2200) Pulse Rate: 76 (04/06 2200)  Labs: Recent Labs    11/02/17 1750  HGB 15.0  HCT 42.3  PLT 192  CREATININE 1.38*    Medical History: Past Medical History:  Diagnosis Date  . Chronic back pain    spondylolisthesis and radiculopathy  . Depression    hx of-per pt no meds now  . Hypertension   . Muscle spasm of back    takes Flexeril as needed  . Weakness    numbness and tingling both feet    Assessment: Admitted with chest pain. Troponin slightly elevated. CBC ok. Starting heparin gtt for rule out ACS.  Goal of Therapy:  Heparin level 0.3-0.7 units/ml Monitor platelets by anticoagulation protocol: Yes   Plan:  - heparin bolus 4000 units x1  And heparin infusion 1100 units/hr - daily HL, CBC - Check 6 hour level   Baldemar FridayMasters, Medha Pippen M 11/02/2017,10:13 PM

## 2017-11-02 NOTE — ED Provider Notes (Signed)
[facility] EMERGENCY DEPARTMENT Provider Note   CSN: [id number] Arrival date & time: 11/02/17  1735     History   Chief Complaint No chief complaint on file.   HPI Jose Schultz is a 52 y.o. male.  He presents to the ED after acute onset chest pain during an argument.  It is worse with moving and breathing.  Made him feel dizzy and lightheaded.  Per nursing triage as he was getting registered he felt lightheaded and fell to the floor striking his head.  Patient denies full loss of consciousness and he denies any headache.  States is never had this chest pain or dizziness before.  He has had no recent illness.  States last month he had a GI illness.  Is been taking his medications regularly.  He denies any history of cardiac disease.  He has a history of hypertension and has been taking his occasions as scheduled.  He rates the chest pain now a 7 out of 10.  Denies any drugs and specifically no cocaine.  The history is provided by the patient.  Chest Pain   This is a new problem. The current episode started 1 to 2 hours ago. The problem occurs constantly. The problem has not changed since onset.The pain is associated with movement and breathing. The pain is at a severity of 7/10. The quality of the pain is described as sharp and pleuritic. The pain does not radiate. Associated symptoms include dizziness and syncope. Pertinent negatives include no abdominal pain, no back pain, no cough, no diaphoresis, no fever, no hemoptysis, no leg pain, no lower extremity edema, no nausea, no numbness, no palpitations, no shortness of breath, no sputum production, no vomiting and no weakness. He has tried nothing for the symptoms. The treatment provided no relief. Risk factors include smoking/tobacco exposure.  His past medical history is significant for hypertension.  Pertinent negatives for past medical history include no seizures.    Past Medical History:  Diagnosis Date  . Chronic  back pain    spondylolisthesis and radiculopathy  . Depression    hx of-per pt no meds now  . Hypertension   . Muscle spasm of back    takes Flexeril as needed  . Weakness    numbness and tingling both feet    Patient Active Problem List   Diagnosis Date Noted  . Spondylolisthesis of lumbar region 09/02/2013  . Depressive disorder, not elsewhere classified 10/08/2012  . Back pain 10/06/2012    Past Surgical History:  Procedure Laterality Date  . BACK SURGERY    . right 5th finger surgery     in high school        Home Medications    Prior to Admission medications   Medication Sig Start Date End Date Taking? Authorizing Provider  albuterol (PROVENTIL HFA;VENTOLIN HFA) 108 (90 Base) MCG/ACT inhaler Inhale 2 puffs into the lungs every 4 (four) hours as needed for wheezing or shortness of breath. 01/02/17   Hayden Rasmussen, NP  azithromycin (ZITHROMAX) 250 MG tablet 2 tabs po on day one, then one tablet po once daily on days 2-5. 01/02/17   Hayden Rasmussen, NP  cefdinir (OMNICEF) 300 MG capsule Take 1 capsule (300 mg total) by mouth 2 (two) times daily. 01/02/17   Hayden Rasmussen, NP  gabapentin (NEURONTIN) 600 MG tablet Take 1,200 mg by mouth 2 (two) times daily.    [provider]  Hyoscyamine Sulfate SL (LEVSIN/SL) 0.125 MG SUBL Place 1  tablet under the tongue every 8 (eight) hours as needed. 12/19/16   Elvina SidleLauenstein, Kurt, MD  ibuprofen (ADVIL,MOTRIN) 800 MG tablet Take 1 tablet (800 mg total) by mouth 3 (three) times daily. 08/11/14   Baker, Adrian BlackwaterZachary H, PA-C  lisinopril-hydrochlorothiazide (PRINZIDE) 20-12.5 MG per tablet Take 1 tablet by mouth daily. 09/02/14   Rodolph Bongorey, Evan S, MD  oxyCODONE-acetaminophen (PERCOCET) 10-325 MG per tablet Take 1 tablet by mouth 2 (two) times daily as needed for pain. Patient taking differently: Take 1 tablet by mouth 4 (four) times daily.  08/13/14   Rodolph Bongorey, Evan S, MD  ranitidine (ZANTAC) 150 MG tablet Take 150 mg by mouth 2 (two) times daily.    [provider]    Family History No family history on file.  Social History Social History   Tobacco Use  . Smoking status: Current Every Day Smoker    Packs/day: 0.50    Years: 32.00    Pack years: 16.00    Types: Cigarettes  . Smokeless tobacco: Never Used  Substance Use Topics  . Alcohol use: No  . Drug use: Yes    Types: "Crack" cocaine    Comment: denies anything in past yr     Allergies   Penicillins; Doxycycline; Morphine and related; Bee venom; and Other   Review of Systems Review of Systems  Constitutional: Negative for chills, diaphoresis and fever.  HENT: Negative for ear pain and sore throat.   Eyes: Negative for pain and visual disturbance.  Respiratory: Negative for cough, hemoptysis, sputum production and shortness of breath.   Cardiovascular: Positive for chest pain and syncope. Negative for palpitations.  Gastrointestinal: Negative for abdominal pain, nausea and vomiting.  Genitourinary: Negative for dysuria and hematuria.  Musculoskeletal: Negative for arthralgias and back pain.  Skin: Negative for color change and rash.  Neurological: Positive for dizziness and syncope. Negative for seizures, weakness and numbness.  All other systems reviewed and are negative.    Physical Exam Updated Vital Signs BP (!) 155/93   Pulse 98   Temp 98 F (36.7 C) (Oral)   Resp (!) 28   SpO2 100%   Physical Exam  Constitutional: He appears well-developed and well-nourished.  HENT:  Head: Normocephalic and atraumatic.  Right Ear: External ear normal.  Left Ear: External ear normal.  Nose: Nose normal.  Mouth/Throat: Oropharynx is clear and moist.  Eyes: Pupils are equal, round, and reactive to light. Conjunctivae and EOM are normal.  Neck: Normal range of motion. Neck supple.  Cardiovascular: Normal rate and regular rhythm.  No murmur heard. Pulmonary/Chest: Effort normal and breath sounds normal. No respiratory distress.  Abdominal: Soft. There is no  tenderness.  Musculoskeletal: He exhibits no edema, tenderness or deformity.  Neurological: He is alert.  Skin: Skin is warm and dry. Capillary refill takes less than 2 seconds.  Psychiatric: He has a normal mood and affect.  Nursing note and vitals reviewed.    ED Treatments / Results  Labs (all labs ordered are listed, but only abnormal results are displayed) Labs Reviewed  BASIC METABOLIC PANEL - Abnormal; Notable for the following components:      Result Value   Potassium 3.4 (*)    Creatinine, Ser 1.38 (*)    Calcium 8.3 (*)    GFR calc non Af Amer 59 (*)    All other components within normal limits  CBC  I-STAT TROPONIN, ED    EKG EKG Interpretation  Date/Time:  Saturday November 02 2017 17:43:46 EDT Ventricular  Rate:  103 PR Interval:  148 QRS Duration: 70 QT Interval:  338 QTC Calculation: 442 R Axis:   35 Text Interpretation:  Sinus tachycardia Nonspecific T wave abnormality Abnormal ECG new t wave inversion 3 otherwise similar to prior 3/19 Confirmed by Meridee Score [phone]) on 11/02/2017 6:34:18 PM   Radiology Dg Chest 2 View  Result Date: 11/02/2017 CLINICAL DATA:  Chest pain today. EXAM: CHEST - 2 VIEW COMPARISON:  January 07, 2017 FINDINGS: The heart size and mediastinal contours are within normal limits. Both lungs are clear. The visualized skeletal structures are unremarkable. IMPRESSION: No active cardiopulmonary disease. Electronically Signed   By: Sherian Rein M.D.   On: 11/02/2017 18:18    Procedures .Critical Care Performed by: Terrilee Files, MD Authorized by: Terrilee Files, MD   Critical care provider statement:    Critical care time (minutes):  36   Critical care time was exclusive of:  Separately billable procedures and treating other patients   Critical care was necessary to treat or prevent imminent or life-threatening deterioration of the following conditions:  Cardiac failure   Critical care was time spent personally by me on the  following activities:  Development of treatment plan with patient or surrogate, discussions with consultants, evaluation of patient's response to treatment, examination of patient, obtaining history from patient or surrogate, ordering and performing treatments and interventions, ordering and review of laboratory studies, ordering and review of radiographic studies, pulse oximetry, re-evaluation of patient's condition and review of old charts   I assumed direction of critical care for this patient from another provider in my specialty: no     (including critical care time)  Medications Ordered in ED Medications  aspirin chewable tablet 324 mg (has no administration in time range)  nitroGLYCERIN (NITROSTAT) SL tablet 0.4 mg (has no administration in time range)     Initial Impression / Assessment and Plan / ED Course  I have reviewed the triage vital signs and the nursing notes.  Pertinent labs & imaging results that were available during my care of the patient were reviewed by me and considered in my medical decision making (see chart for details).  Clinical Course as of Nov 05 1138  Sat Nov 02, 2017  1950 Reevaluated patient.  Since pain is now down to 5 with no interventions.Establishing IV will give him some nitro and see if this helps at all.  He will at least need a delta troponin if the pain started just prior to arrival.   [MB]  1950 Differential diagnosis includes ACS, PE, pneumothorax,GERD,musculoskeletal/    [MB]  2033 Single nitro with complete resolution of his discomfort.  D-dimer negative and for strep negative.  He will at least need a second troponin.   [MB]  2141 Discussed with cardiology who recommends patient be admitted and heparinized.  They will follow along and see the patient tomorrow.   [MB]  2206 Discussed with the internal medicine admitting team who will admit the patient her service.   [MB]    Clinical Course User Index [MB] Terrilee Files, MD    Final  Clinical Impressions(s) / ED Diagnoses   Final diagnoses:  ACS (acute coronary syndrome) [facility])    ED Discharge Orders    None       Terrilee Files, MD 11/04/17 [phone]

## 2017-11-03 ENCOUNTER — Other Ambulatory Visit: Payer: Self-pay

## 2017-11-03 ENCOUNTER — Inpatient Hospital Stay (HOSPITAL_COMMUNITY): Payer: BLUE CROSS/BLUE SHIELD

## 2017-11-03 ENCOUNTER — Encounter (HOSPITAL_COMMUNITY): Payer: Self-pay | Admitting: *Deleted

## 2017-11-03 DIAGNOSIS — N179 Acute kidney failure, unspecified: Secondary | ICD-10-CM

## 2017-11-03 DIAGNOSIS — R55 Syncope and collapse: Secondary | ICD-10-CM

## 2017-11-03 DIAGNOSIS — Z79891 Long term (current) use of opiate analgesic: Secondary | ICD-10-CM

## 2017-11-03 DIAGNOSIS — K219 Gastro-esophageal reflux disease without esophagitis: Secondary | ICD-10-CM

## 2017-11-03 DIAGNOSIS — E876 Hypokalemia: Secondary | ICD-10-CM

## 2017-11-03 DIAGNOSIS — I200 Unstable angina: Secondary | ICD-10-CM

## 2017-11-03 DIAGNOSIS — I10 Essential (primary) hypertension: Secondary | ICD-10-CM

## 2017-11-03 DIAGNOSIS — Z79899 Other long term (current) drug therapy: Secondary | ICD-10-CM

## 2017-11-03 DIAGNOSIS — F339 Major depressive disorder, recurrent, unspecified: Secondary | ICD-10-CM

## 2017-11-03 DIAGNOSIS — M549 Dorsalgia, unspecified: Secondary | ICD-10-CM

## 2017-11-03 DIAGNOSIS — G8929 Other chronic pain: Secondary | ICD-10-CM

## 2017-11-03 DIAGNOSIS — I214 Non-ST elevation (NSTEMI) myocardial infarction: Secondary | ICD-10-CM

## 2017-11-03 DIAGNOSIS — R072 Precordial pain: Secondary | ICD-10-CM

## 2017-11-03 LAB — CBC
HCT: 41.8 % (ref 39.0–52.0)
HEMOGLOBIN: 14.1 g/dL (ref 13.0–17.0)
MCH: 29.9 pg (ref 26.0–34.0)
MCHC: 33.7 g/dL (ref 30.0–36.0)
MCV: 88.6 fL (ref 78.0–100.0)
Platelets: 176 10*3/uL (ref 150–400)
RBC: 4.72 MIL/uL (ref 4.22–5.81)
RDW: 13 % (ref 11.5–15.5)
WBC: 5.6 10*3/uL (ref 4.0–10.5)

## 2017-11-03 LAB — COMPREHENSIVE METABOLIC PANEL
ALBUMIN: 2.8 g/dL — AB (ref 3.5–5.0)
ALT: 18 U/L (ref 17–63)
ANION GAP: 10 (ref 5–15)
AST: 61 U/L — ABNORMAL HIGH (ref 15–41)
Alkaline Phosphatase: 60 U/L (ref 38–126)
BUN: 8 mg/dL (ref 6–20)
CO2: 21 mmol/L — AB (ref 22–32)
CREATININE: 1.25 mg/dL — AB (ref 0.61–1.24)
Calcium: 7.9 mg/dL — ABNORMAL LOW (ref 8.9–10.3)
Chloride: 109 mmol/L (ref 101–111)
GFR calc non Af Amer: >60 mL/min (ref >60)
GLUCOSE: 120 mg/dL — AB (ref 65–99)
Potassium: 3.2 mmol/L — ABNORMAL LOW (ref 3.5–5.1)
Sodium: 140 mmol/L (ref 135–145)
Total Bilirubin: 0.6 mg/dL (ref 0.3–1.2)
Total Protein: 4.8 g/dL — ABNORMAL LOW (ref 6.5–8.1)

## 2017-11-03 LAB — ECHOCARDIOGRAM COMPLETE
Height: 68 in
Weight: 2990.4 oz

## 2017-11-03 LAB — LIPID PANEL
CHOL/HDL RATIO: 4.8 ratio
CHOLESTEROL: 149 mg/dL (ref 0–200)
HDL: 31 mg/dL — ABNORMAL LOW (ref >40)
LDL CALC: 101 mg/dL — AB (ref 0–99)
TRIGLYCERIDES: 85 mg/dL (ref <150)
VLDL: 17 mg/dL (ref 0–40)

## 2017-11-03 LAB — HIV ANTIBODY (ROUTINE TESTING W REFLEX): HIV SCREEN 4TH GENERATION: NONREACTIVE

## 2017-11-03 LAB — HEPARIN LEVEL (UNFRACTIONATED)
Heparin Unfractionated: 0.12 IU/mL — ABNORMAL LOW (ref 0.30–0.70)
Heparin Unfractionated: 0.33 IU/mL (ref 0.30–0.70)

## 2017-11-03 LAB — TROPONIN I
Troponin I: 6.35 ng/mL (ref <0.03)
Troponin I: 10.8 ng/mL (ref <0.03)
Troponin I: 10.37 ng/mL (ref <0.03)

## 2017-11-03 MED ORDER — ASPIRIN 81 MG PO CHEW
81.0000 mg | CHEWABLE_TABLET | ORAL | Status: AC
  Administered 2017-11-04: 81 mg via ORAL
  Filled 2017-11-03: qty 1

## 2017-11-03 MED ORDER — SODIUM CHLORIDE 0.9 % IV SOLN: 250.0000 mL | INTRAVENOUS | Status: DC | PRN

## 2017-11-03 MED ORDER — POTASSIUM CHLORIDE CRYS ER 20 MEQ PO TBCR
20.0000 meq | EXTENDED_RELEASE_TABLET | Freq: Every day | ORAL | Status: DC
  Administered 2017-11-04: 20 meq via ORAL
  Filled 2017-11-03: qty 1

## 2017-11-03 MED ORDER — SODIUM CHLORIDE 0.9% FLUSH
3.0000 mL | Freq: Two times a day (BID) | INTRAVENOUS | Status: DC
  Administered 2017-11-03: 3 mL via INTRAVENOUS

## 2017-11-03 MED ORDER — SODIUM CHLORIDE 0.9% FLUSH
3.0000 mL | INTRAVENOUS | Status: DC | PRN
Start: 2017-11-03 — End: 2017-11-04

## 2017-11-03 MED ORDER — HEPARIN BOLUS VIA INFUSION
2500.0000 [IU] | Freq: Once | INTRAVENOUS | Status: AC
  Administered 2017-11-03: 2500 [IU] via INTRAVENOUS
  Filled 2017-11-03: qty 2500

## 2017-11-03 MED ORDER — SODIUM CHLORIDE 0.9 % WEIGHT BASED INFUSION
3.0000 mL/kg/h | INTRAVENOUS | Status: DC
  Administered 2017-11-04: 3 mL/kg/h via INTRAVENOUS

## 2017-11-03 MED ORDER — POTASSIUM CHLORIDE CRYS ER 20 MEQ PO TBCR
40.0000 meq | EXTENDED_RELEASE_TABLET | Freq: Once | ORAL | Status: AC
  Administered 2017-11-03: 40 meq via ORAL
  Filled 2017-11-03: qty 2

## 2017-11-03 MED ORDER — SODIUM CHLORIDE 0.9 % WEIGHT BASED INFUSION
1.0000 mL/kg/h | INTRAVENOUS | Status: DC
  Administered 2017-11-04 (×2): 1 mL/kg/h via INTRAVENOUS

## 2017-11-03 NOTE — Plan of Care (Signed)
  Problem: Education: Goal: Understanding of cardiac disease, CV risk reduction, and recovery process will improve Outcome: Completed/Met   Problem: Activity: Goal: Ability to tolerate increased activity will improve Outcome: Completed/Met   Problem: Cardiac: Goal: Ability to achieve and maintain adequate cardiovascular perfusion will improve Outcome: Completed/Met   Problem: Coping: Goal: Level of anxiety will decrease Outcome: Completed/Met   Problem: Pain Managment: Goal: General experience of comfort will improve Outcome: Completed/Met

## 2017-11-03 NOTE — Progress Notes (Signed)
ANTICOAGULATION CONSULT NOTE   Pharmacy Consult for heparin Indication: chest pain/ACS  Allergies  Allergen Reactions  . Penicillins Diarrhea    unknown  . Doxycycline     Rash, hives, bumps   . Morphine And Related Other (See Comments)    siezures  . Bee Venom Other (See Comments)  . Other Other (See Comments)    [place]    Patient Measurements: Heparin Dosing Weight: 86 kg  Vital Signs: Temp: 98.3 F (36.8 C) (04/07 1226) Temp Source: Oral (04/07 1226) BP: 117/86 (04/07 1226) Pulse Rate: 70 (04/07 1226)  Labs: Recent Labs    11/02/17 1750  11/03/17 0433 11/03/17 0722 11/03/17 1124 11/03/17 1558  HGB 15.0  --  14.1  --   --   --   HCT 42.3  --  41.8  --   --   --   PLT 192  --  176  --   --   --   HEPARINUNFRC  --   --   --  0.12*  --  0.33  CREATININE 1.38*  --  1.25*  --   --   --   TROPONINI  --    < > 6.35*  --  10.80* 10.37*   < > = values in this interval not displayed.    Medical History: Past Medical History:  Diagnosis Date  . Chronic back pain    spondylolisthesis and radiculopathy  . Depression    hx of-per pt no meds now  . Hypertension   . Muscle spasm of back    takes Flexeril as needed  . Weakness    numbness and tingling both feet    Assessment: 52 yo male admitted with chest pain on heparin. Plans for cath in am -heparin level is at goal after bolus and increase to 1250 units/hr  Goal of Therapy:  Heparin level 0.3-0.7 units/ml Monitor platelets by anticoagulation protocol: Yes   Plan:   -No heparin changes needed -Daily heparin level and CBC  Harland GermanAndrew Tiari Andringa, PharmD Clinical Pharmacist C4/01/2018 5:22 PM

## 2017-11-03 NOTE — Progress Notes (Signed)
  Echocardiogram 2D Echocardiogram has been performed.  Jose Schultz 11/03/2017, 2:11 PM

## 2017-11-03 NOTE — Progress Notes (Signed)
ANTICOAGULATION CONSULT NOTE - Initial Consult  Pharmacy Consult for heparin Indication: chest pain/ACS  Allergies  Allergen Reactions  . Penicillins Diarrhea    unknown  . Doxycycline     Rash, hives, bumps   . Morphine And Related Other (See Comments)    siezures  . Bee Venom Other (See Comments)  . Other Other (See Comments)    [place]    Patient Measurements: Heparin Dosing Weight: 86 kg  Vital Signs: Temp: 98.4 F (36.9 C) (04/07 0936) Temp Source: Oral (04/07 0936) BP: 122/86 (04/07 0936) Pulse Rate: 72 (04/07 0936)  Labs: Recent Labs    11/02/17 1750 11/02/17 2245 11/03/17 0433 11/03/17 0722  HGB 15.0  --  14.1  --   HCT 42.3  --  41.8  --   PLT 192  --  176  --   HEPARINUNFRC  --   --   --  0.12*  CREATININE 1.38*  --  1.25*  --   TROPONINI  --  0.50* 6.35*  --     Medical History: Past Medical History:  Diagnosis Date  . Chronic back pain    spondylolisthesis and radiculopathy  . Depression    hx of-per pt no meds now  . Hypertension   . Muscle spasm of back    takes Flexeril as needed  . Weakness    numbness and tingling both feet    Assessment: Admitted with chest pain. Troponin slightly elevated. CBC ok. Starting heparin gtt for rule out ACS. Heparin level subtherapeutic at 0.12. No bleeding documented. Plans for future heart cath.  Goal of Therapy:  Heparin level 0.3-0.7 units/ml Monitor platelets by anticoagulation protocol: Yes   Plan:  Rebolus heparin 2500 units, then increase to 1250 units/hr 6 hour heparin level Daily heparin level, CBC Monitor clinical course, s/sx bleeding  Donnella Biyler Odessia Asleson, PharmD PGY1 Acute Care Pharmacy Resident Pager: [phone] 11/03/2017,10:00 AM

## 2017-11-03 NOTE — Progress Notes (Signed)
Troponin critical called IM MD.EKG still shows NSR. Patient denies distress. Continue to moniter patient closely.

## 2017-11-03 NOTE — Plan of Care (Signed)
  Problem: Activity: Goal: Risk for activity intolerance will decrease Outcome: Progressing   Problem: Safety: Goal: Ability to remain free from injury will improve Outcome: Progressing   Problem: Elimination: Goal: Will not experience complications related to bowel motility Outcome: Not Met (add Reason)  PT. States he has not had BM since Friday.  Problem: Skin Integrity: Goal: Risk for impaired skin integrity will decrease Outcome: Completed/Met

## 2017-11-03 NOTE — Progress Notes (Signed)
   Subjective: [name] was seen resting comfortably in his bed this morning. He had already been seen by cardiology who had spoken with him about a heart catheterization tomorrow. He states that he feels okay and denies chest pain, palpitations, or shortness of breath at this time. No questions or complaints.  Objective:  Vital signs in last 24 hours: Vitals:   11/02/17 2315 11/03/17 0004 11/03/17 0658 11/03/17 0936  BP: 131/88 129/90 (!) 130/91 122/86  Pulse: 76 68 72 72  Resp: 14 16 18 20   Temp:  98 F (36.7 C)  98.4 F (36.9 C)  TempSrc:  Oral  Oral  SpO2: 100% 98% 96% 99%  Weight:  186 lb 14.4 oz (84.8 kg)    Height:  5\' 8"  (1.727 m)     Physical Exam  Constitutional: He is oriented to person, place, and time. He appears well-developed and well-nourished. No distress.  HENT:  Head: Normocephalic and atraumatic.  Cardiovascular: Normal rate, regular rhythm, normal heart sounds and intact distal pulses.  Pulmonary/Chest: Effort normal and breath sounds normal. No respiratory distress.  Abdominal: Soft. Bowel sounds are normal. He exhibits no distension. There is no tenderness.  Musculoskeletal: He exhibits no edema or deformity.  Neurological: He is alert and oriented to person, place, and time.  Skin: Skin is warm and dry.   Assessment/Plan: 52yo male with PMH of HTN, GERD, depression, and chronic back pain who presents with chest pain, found to have elevated troponin and EKG with new T wave inversion in lead III, consistent with NSTEMI. Cardiology has been consulted, recommending initiation of IV heparin drip.  NSTEMI: Presented with left-sided CP with typical features, provoked by emotional stress and relieved by NTG. Troponin 0.00>>0.19>>0.50>>6.35. Repeat EKG with new T-wave inversion in lead III, possible ST elevation in lead V3. UDS negative. D-dimer negative. CBC WNL. Lipid Panel:  HDL 31, LDL 101. - Cardiology following, appreciate their recommnedations - SL NTG  0.4mg  q145min PRN for chest pain - IV heparin drip - Aspirin 81mg  daily - Trend troponins - HH diet, NPO at MN - TTE  Syncope: Unclear etiology, may be related to ACS. No s/s of seizure. No carotid bruits. He did report pre-syncopal symptoms - could be vasovagal/situational.  - Telemetry - Management for ACS, as above - TTE  Hypokalemia Acute Renal Failure:Cr 1.38 on admission (Baseline 1.2-1.5). K 2.4 on admission and 3.2 on repeat. - KCl 40 mEq PO today, 20 mEq PO starting 4/8 - Cr 1.25 (4/7) - BMP in AM  HTN: Home regimen includes lisinopril-HCTZ 20-12.5mg  daily. Normotensive this admission. - Holding home lisinopril-HCTZ given normotension, can restart if BP elevated  Chronic back pain: On Percocet 10-325mg  q6h PRN, [facility], as well as tizanidine 4mg  QHS, and gabapentin. - Continue home gabapentin 1200mg  BID - Continue home Percocet 10-325mg  q6h PRN  Diet: HH, NPO at MN VTE PPx: IV heparin gtt Code Status: Full code   Dispo: Anticipated discharge in approximately 1-2 day(s).   Beola CordMelvin, Alexander, MD 11/03/2017, 11:26 AM Pager: [phone]

## 2017-11-03 NOTE — Progress Notes (Signed)
Cardiology Consultation:   Patient ID: Jose Schultz; [id number]; 1966/06/19   Admit date: 11/02/2017 Date of Consult: 11/03/2017  Primary Care Provider: [facility] Cardiologist: Nahser  Primary Electrophysiologist:     Patient Profile:   Jose Schultz is a 52 y.o. male with a hx of hypertension who is being seen today for the evaluation of chest discomfort and positive troponin at the request of  Dr. Heide Spark. .  History of Present Illness:   Jose Schultz is a 52 year old gentleman with a history of hypertension and depression.  He presented to the emergency room after developing chest pain.  Recent had been in a argument.  His pain was described as sharp and squeezing .  It was worsened with taking a deep breath and with movement.  Pain lasted for about an hour and was relieved when he arrived in the emergency room and received nitroglycerin.  He also was noted to have a syncopal episode when he arrived at the registration desk in the emergency room.  He has not had any further episodes of chest pain since that time.  Patient works an active job doing Photographer.  He denies any chest pain or shortness of breath.  He is able to do lots of physical activity without chest pain, shortness breath, syncope, presyncope.  He denies any bleeding or easy bruising.  He denies any syncope or presyncope.  He denies any recent fevers.  He denies any skin rash.  He denies any cold symptoms.  He smoked cigarettes up until yesterday.  He says he is stopped at this point.  Family history-mother had a history of hypertension and ultimately had renal failure.  She had a kidney transplant.  His father was relatively healthy.  Past Medical History:  Diagnosis Date  . Chronic back pain    spondylolisthesis and radiculopathy  . Depression    hx of-per pt no meds now  . Hypertension   . Muscle spasm of back    takes Flexeril as needed  . Weakness      numbness and tingling both feet    Past Surgical History:  Procedure Laterality Date  . BACK SURGERY    . right 5th finger surgery     in high school     Home Medications:  Prior to Admission medications   Medication Sig Start Date End Date Taking? Authorizing Provider  albuterol (PROVENTIL HFA;VENTOLIN HFA) 108 (90 Base) MCG/ACT inhaler Inhale 2 puffs into the lungs every 4 (four) hours as needed for wheezing or shortness of breath. 01/02/17  Yes Mabe, Onalee Hua, NP  gabapentin (NEURONTIN) 600 MG tablet Take 1,200 mg by mouth 2 (two) times daily.   Yes [provider]  ibuprofen (ADVIL,MOTRIN) 800 MG tablet Take 1 tablet (800 mg total) by mouth 3 (three) times daily. 08/11/14  Yes Baker, Adrian Blackwater, PA-C  lisinopril-hydrochlorothiazide (PRINZIDE) 20-12.5 MG per tablet Take 1 tablet by mouth daily. 09/02/14  Yes Rodolph Bong, MD  oxyCODONE-acetaminophen (PERCOCET) 10-325 MG per tablet Take 1 tablet by mouth 2 (two) times daily as needed for pain. Patient taking differently: Take 1 tablet by mouth 4 (four) times daily.  08/13/14  Yes Rodolph Bong, MD  ranitidine (ZANTAC) 150 MG tablet Take 150 mg by mouth 2 (two) times daily.   Yes [provider]  tiZANidine (ZANAFLEX) 4 MG tablet Take 4 mg by mouth 2 (two) times daily. 10/29/17  Yes [provider]  Inpatient Medications: Scheduled Meds: . gabapentin  1,200 mg Oral BID   Continuous Infusions: . heparin 1,100 Units/hr (11/02/17 2314)   PRN Meds: acetaminophen **OR** acetaminophen, famotidine, nitroGLYCERIN, oxyCODONE-acetaminophen **AND** oxyCODONE, senna-docusate  Allergies:    Allergies  Allergen Reactions  . Penicillins Diarrhea    unknown  . Doxycycline     Rash, hives, bumps   . Morphine And Related Other (See Comments)    siezures  . Bee Venom Other (See Comments)  . Other Other (See Comments)    [place]    Social History:   Social History   Socioeconomic History  . Marital status:  Single    Spouse name: Not on file  . Number of children: Not on file  . Years of education: Not on file  . Highest education level: Not on file  Occupational History  . Not on file  Social Needs  . Financial resource strain: Not on file  . Food insecurity:    Worry: Not on file    Inability: Not on file  . Transportation needs:    Medical: Not on file    Non-medical: Not on file  Tobacco Use  . Smoking status: Current Every Day Smoker    Packs/day: 0.50    Years: 32.00    Pack years: 16.00    Types: Cigarettes  . Smokeless tobacco: Never Used  Substance and Sexual Activity  . Alcohol use: No  . Drug use: Not Currently    Types: "Crack" cocaine    Comment: denies anything in past yr  . Sexual activity: Not Currently  Lifestyle  . Physical activity:    Days per week: Not on file    Minutes per session: Not on file  . Stress: Not on file  Relationships  . Social connections:    Talks on phone: Not on file    Gets together: Not on file    Attends religious service: Not on file    Active member of club or organization: Not on file    Attends meetings of clubs or organizations: Not on file    Relationship status: Not on file  . Intimate partner violence:    Fear of current or ex partner: Not on file    Emotionally abused: Not on file    Physically abused: Not on file    Forced sexual activity: Not on file  Other Topics Concern  . Not on file  Social History Narrative  . Not on file    Family History:    Family History  Problem Relation Age of Onset  . Hypertension Mother   . Kidney failure Mother      ROS:  Please see the history of present illness.   All other ROS reviewed and negative.     Physical Exam/Data:   Vitals:   11/02/17 2230 11/02/17 2315 11/03/17 0004 11/03/17 0658  BP: (!) 135/94 131/88 129/90 (!) 130/91  Pulse: 72 76 68 72  Resp: 13 14 16 18   Temp:   98 F (36.7 C)   TempSrc:   Oral   SpO2: 99% 100% 98% 96%  Weight:   186 lb 14.4 oz  (84.8 kg)   Height:   5\' 8"  (1.727 m)     Intake/Output Summary (Last 24 hours) at 11/03/2017 0822 Last data filed at 11/03/2017 0700 Gross per 24 hour  Intake 222 ml  Output 1180 ml  Net -958 ml   Filed Weights   11/02/17 2204 11/03/17 0004  Weight:  196 lb 3.4 oz (89 kg) 186 lb 14.4 oz (84.8 kg)   Body mass index is 28.42 kg/m.  General:  Well nourished, well developed, in no acute distress, currently pain-free. HEENT: normal Lymph: no adenopathy Neck: no JVD Endocrine:  No thryomegaly Vascular: No carotid bruits; FA pulses 2+ bilaterally without bruits  Cardiac:  normal S1, S2; RRR; no murmur  Lungs:  clear to auscultation bilaterally, no wheezing, rhonchi or rales  Abd: soft, nontender, no hepatomegaly  Ext: no edema Musculoskeletal:  No deformities, BUE and BLE strength normal and equal Skin: warm and dry  Neuro:  CNs 2-12 intact, no focal abnormalities noted Psych:  Normal affect   EKG:  The EKG was personally reviewed and demonstrates: Normal sinus rhythm.  No ST or T wave changes. Telemetry:  Telemetry was personally reviewed and demonstrates: Normal sinus rhythm  Relevant CV Studies:   Laboratory Data:  Chemistry Recent Labs  Lab 11/02/17 1750 11/03/17 0433  NA 137 140  K 3.4* 3.2*  CL 106 109  CO2 23 21*  GLUCOSE 96 120*  BUN 10 8  CREATININE 1.38* 1.25*  CALCIUM 8.3* 7.9*  GFRNONAA 59* >60  GFRAA >60 >60  ANIONGAP 8 10    Recent Labs  Lab 11/03/17 0433  PROT 4.8*  ALBUMIN 2.8*  AST 61*  ALT 18  ALKPHOS 60  BILITOT 0.6   Hematology Recent Labs  Lab 11/02/17 1750 11/03/17 0433  WBC 6.3 5.6  RBC 4.78 4.72  HGB 15.0 14.1  HCT 42.3 41.8  MCV 88.5 88.6  MCH 31.4 29.9  MCHC 35.5 33.7  RDW 13.1 13.0  PLT 192 176   Cardiac Enzymes Recent Labs  Lab 11/02/17 2245 11/03/17 0433  TROPONINI 0.50* 6.35*    Recent Labs  Lab 11/02/17 1755 11/02/17 2103  TROPIPOC 0.00 0.19*    BNPNo results for input(s): BNP, PROBNP in the last 168  hours.  DDimer  Recent Labs  Lab 11/02/17 1750  DDIMER <0.27    Radiology/Studies:  Dg Chest 2 View  Result Date: 11/02/2017 CLINICAL DATA:  Chest pain today. EXAM: CHEST - 2 VIEW COMPARISON:  January 07, 2017 FINDINGS: The heart size and mediastinal contours are within normal limits. Both lungs are clear. The visualized skeletal structures are unremarkable. IMPRESSION: No active cardiopulmonary disease. Electronically Signed   By: Sherian Rein M.D.   On: 11/02/2017 18:18    Assessment and Plan:   1. 1.  Non-ST segment elevation myocardial infarction: The patient presents with an episode of chest pain following a heated argument.  He had minimal troponin elevations in the emergency room and now his troponin level is 6.35.  He has no EKG changes.  He is completely pain-free at this point.  It is possible that he has a ruptured plaque in his coronary arteries.  It is also possible that this is a stress-induced myocardial infarction - Takotsubo Syndrome.   He is currently pain-free.  We will keep him on a heparin drip.  He is breathing comfortably.  Advised him to call the nurse if he develops any further symptoms of that chest pain.  We will feed him today.  Will write Orders for tomorrow.  I discussed the risks, benefits, options regarding heart catheterization.  He understands and agrees to proceed.  2.  Hypertension: Blood pressure is fairly well controlled.  3.  Hypokalemia.  We will replace his potassium.   For questions or updates, please contact [facility] Please consult [email or web address] for contact  info under Cardiology/STEMI.   Signed, Kristeen MissPhilip Nahser, MD  11/03/2017 8:22 AM

## 2017-11-04 ENCOUNTER — Encounter (HOSPITAL_COMMUNITY)
Admission: EM | Disposition: A | Discharge status: Home / Self Care | Payer: Self-pay | Source: Home / Self Care | Attending: Internal Medicine

## 2017-11-04 DIAGNOSIS — I2510 Atherosclerotic heart disease of native coronary artery without angina pectoris: Secondary | ICD-10-CM

## 2017-11-04 DIAGNOSIS — I340 Nonrheumatic mitral (valve) insufficiency: Secondary | ICD-10-CM

## 2017-11-04 DIAGNOSIS — I214 Non-ST elevation (NSTEMI) myocardial infarction: Principal | ICD-10-CM

## 2017-11-04 HISTORY — PX: LEFT HEART CATH AND CORONARY ANGIOGRAPHY: CATH118249

## 2017-11-04 HISTORY — PX: CORONARY STENT INTERVENTION: CATH118234

## 2017-11-04 LAB — CBC
HEMATOCRIT: 40.8 % (ref 39.0–52.0)
Hemoglobin: 13.6 g/dL (ref 13.0–17.0)
MCH: 30 pg (ref 26.0–34.0)
MCHC: 33.3 g/dL (ref 30.0–36.0)
MCV: 90.1 fL (ref 78.0–100.0)
Platelets: 166 10*3/uL (ref 150–400)
RBC: 4.53 MIL/uL (ref 4.22–5.81)
RDW: 13.3 % (ref 11.5–15.5)
WBC: 5.6 10*3/uL (ref 4.0–10.5)

## 2017-11-04 LAB — BASIC METABOLIC PANEL
Anion gap: 7 (ref 5–15)
BUN: 10 mg/dL (ref 6–20)
CALCIUM: 8.2 mg/dL — AB (ref 8.9–10.3)
CO2: 23 mmol/L (ref 22–32)
CREATININE: 1.19 mg/dL (ref 0.61–1.24)
Chloride: 111 mmol/L (ref 101–111)
GFR calc Af Amer: >60 mL/min (ref >60)
GFR calc non Af Amer: >60 mL/min (ref >60)
GLUCOSE: 98 mg/dL (ref 65–99)
Potassium: 3.8 mmol/L (ref 3.5–5.1)
Sodium: 141 mmol/L (ref 135–145)

## 2017-11-04 LAB — POCT ACTIVATED CLOTTING TIME: ACTIVATED CLOTTING TIME: 318 s

## 2017-11-04 LAB — HEPARIN LEVEL (UNFRACTIONATED): Heparin Unfractionated: 0.22 IU/mL — ABNORMAL LOW (ref 0.30–0.70)

## 2017-11-04 LAB — HEMOGLOBIN A1C
Hgb A1c MFr Bld: 5.1 % (ref 4.8–5.6)
Mean Plasma Glucose: 99.67 mg/dL

## 2017-11-04 LAB — TSH: TSH: 0.859 u[IU]/mL (ref 0.350–4.500)

## 2017-11-04 SURGERY — LEFT HEART CATH AND CORONARY ANGIOGRAPHY
Anesthesia: LOCAL

## 2017-11-04 MED ORDER — THE SENSUOUS HEART BOOK
Freq: Once | Status: DC
  Filled 2017-11-04: qty 1

## 2017-11-04 MED ORDER — TIROFIBAN (AGGRASTAT) BOLUS VIA INFUSION
INTRAVENOUS | Status: DC | PRN
  Administered 2017-11-04: 2137.5 ug via INTRAVENOUS

## 2017-11-04 MED ORDER — LIDOCAINE HCL (PF) 1 % IJ SOLN
INTRAMUSCULAR | Status: AC
  Filled 2017-11-04: qty 30

## 2017-11-04 MED ORDER — VERAPAMIL HCL 2.5 MG/ML IV SOLN
INTRAVENOUS | Status: DC | PRN
  Administered 2017-11-04: 10 mL via INTRA_ARTERIAL

## 2017-11-04 MED ORDER — ONDANSETRON HCL 4 MG/2ML IJ SOLN
4.0000 mg | Freq: Four times a day (QID) | INTRAMUSCULAR | Status: DC | PRN
Start: 2017-11-04 — End: 2017-11-05

## 2017-11-04 MED ORDER — LIDOCAINE HCL (PF) 1 % IJ SOLN
INTRAMUSCULAR | Status: DC | PRN
  Administered 2017-11-04: 2 mL via INTRADERMAL

## 2017-11-04 MED ORDER — SODIUM CHLORIDE 0.9 % IV SOLN: INTRAVENOUS | Status: AC

## 2017-11-04 MED ORDER — ACETAMINOPHEN 325 MG PO TABS: 650.0000 mg | ORAL_TABLET | ORAL | Status: DC | PRN

## 2017-11-04 MED ORDER — HEPARIN SODIUM (PORCINE) 1000 UNIT/ML IJ SOLN
INTRAMUSCULAR | Status: DC | PRN
  Administered 2017-11-04: 4500 [IU] via INTRAVENOUS
  Administered 2017-11-04: 5000 [IU] via INTRAVENOUS

## 2017-11-04 MED ORDER — TICAGRELOR 90 MG PO TABS
ORAL_TABLET | ORAL | Status: AC
  Filled 2017-11-04: qty 2

## 2017-11-04 MED ORDER — SODIUM CHLORIDE 0.9 % IV SOLN: 250.0000 mL | INTRAVENOUS | Status: DC | PRN

## 2017-11-04 MED ORDER — MIDAZOLAM HCL 2 MG/2ML IJ SOLN
INTRAMUSCULAR | Status: AC
Start: 2017-11-04 — End: ?
  Filled 2017-11-04: qty 2

## 2017-11-04 MED ORDER — TICAGRELOR 90 MG PO TABS
ORAL_TABLET | ORAL | Status: DC | PRN
  Administered 2017-11-04: 180 mg via ORAL

## 2017-11-04 MED ORDER — SODIUM CHLORIDE 0.9% FLUSH
3.0000 mL | Freq: Two times a day (BID) | INTRAVENOUS | Status: DC
  Administered 2017-11-04: 3 mL via INTRAVENOUS

## 2017-11-04 MED ORDER — MIDAZOLAM HCL 2 MG/2ML IJ SOLN
INTRAMUSCULAR | Status: DC | PRN
  Administered 2017-11-04: 1 mg via INTRAVENOUS
  Administered 2017-11-04: 2 mg via INTRAVENOUS

## 2017-11-04 MED ORDER — HEPARIN SODIUM (PORCINE) 1000 UNIT/ML IJ SOLN
INTRAMUSCULAR | Status: AC
  Filled 2017-11-04: qty 1

## 2017-11-04 MED ORDER — VERAPAMIL HCL 2.5 MG/ML IV SOLN
INTRAVENOUS | Status: AC
  Filled 2017-11-04: qty 2

## 2017-11-04 MED ORDER — HEPARIN (PORCINE) IN NACL 2-0.9 UNIT/ML-% IJ SOLN
INTRAMUSCULAR | Status: AC
  Filled 2017-11-04: qty 1000

## 2017-11-04 MED ORDER — HEPARIN (PORCINE) IN NACL 2-0.9 UNIT/ML-% IJ SOLN
INTRAMUSCULAR | Status: AC | PRN
  Administered 2017-11-04 (×2): 500 mL via INTRA_ARTERIAL

## 2017-11-04 MED ORDER — HEART ATTACK BOUNCING BOOK
Freq: Once | Status: AC
  Administered 2017-11-04: 22:00:00
  Filled 2017-11-04: qty 1

## 2017-11-04 MED ORDER — ANGIOPLASTY BOOK
Freq: Once | Status: AC
  Administered 2017-11-04: 22:00:00
  Filled 2017-11-04: qty 1

## 2017-11-04 MED ORDER — IOPAMIDOL (ISOVUE-370) INJECTION 76%
INTRAVENOUS | Status: AC
  Filled 2017-11-04: qty 50

## 2017-11-04 MED ORDER — FENTANYL CITRATE (PF) 100 MCG/2ML IJ SOLN
INTRAMUSCULAR | Status: DC | PRN
  Administered 2017-11-04: 25 ug via INTRAVENOUS
  Administered 2017-11-04: 50 ug via INTRAVENOUS

## 2017-11-04 MED ORDER — IOPAMIDOL (ISOVUE-370) INJECTION 76%
INTRAVENOUS | Status: AC
  Filled 2017-11-04: qty 100

## 2017-11-04 MED ORDER — NITROGLYCERIN 1 MG/10 ML FOR IR/CATH LAB
INTRA_ARTERIAL | Status: AC
  Filled 2017-11-04: qty 10

## 2017-11-04 MED ORDER — NITROGLYCERIN 1 MG/10 ML FOR IR/CATH LAB
INTRA_ARTERIAL | Status: DC | PRN
  Administered 2017-11-04: 200 ug via INTRACORONARY
  Administered 2017-11-04: 400 ug via INTRA_ARTERIAL
  Administered 2017-11-04: 200 ug via INTRACORONARY

## 2017-11-04 MED ORDER — LABETALOL HCL 5 MG/ML IV SOLN: 10.0000 mg | INTRAVENOUS | Status: AC | PRN

## 2017-11-04 MED ORDER — TIROFIBAN HCL IN NACL 5-0.9 MG/100ML-% IV SOLN
INTRAVENOUS | Status: AC
  Filled 2017-11-04: qty 100

## 2017-11-04 MED ORDER — ASPIRIN 81 MG PO CHEW
81.0000 mg | CHEWABLE_TABLET | Freq: Every day | ORAL | Status: DC
  Administered 2017-11-05: 09:00:00 81 mg via ORAL
  Filled 2017-11-04: qty 1

## 2017-11-04 MED ORDER — SODIUM CHLORIDE 0.9% FLUSH: 3.0000 mL | INTRAVENOUS | Status: DC | PRN

## 2017-11-04 MED ORDER — IOPAMIDOL (ISOVUE-370) INJECTION 76%
INTRAVENOUS | Status: DC | PRN
  Administered 2017-11-04: 120 mL via INTRA_ARTERIAL

## 2017-11-04 MED ORDER — TICAGRELOR 90 MG PO TABS
90.0000 mg | ORAL_TABLET | Freq: Two times a day (BID) | ORAL | Status: DC
  Administered 2017-11-05 (×2): 90 mg via ORAL
  Filled 2017-11-04 (×2): qty 1

## 2017-11-04 MED ORDER — ENOXAPARIN SODIUM 40 MG/0.4ML ~~LOC~~ SOLN
40.0000 mg | SUBCUTANEOUS | Status: DC
  Administered 2017-11-05: 40 mg via SUBCUTANEOUS
  Filled 2017-11-04: qty 0.4

## 2017-11-04 MED ORDER — FENTANYL CITRATE (PF) 100 MCG/2ML IJ SOLN
INTRAMUSCULAR | Status: AC
  Filled 2017-11-04: qty 2

## 2017-11-04 MED ORDER — HYDRALAZINE HCL 20 MG/ML IJ SOLN: 5.0000 mg | INTRAMUSCULAR | Status: AC | PRN

## 2017-11-04 MED ORDER — MIDAZOLAM HCL 2 MG/2ML IJ SOLN
INTRAMUSCULAR | Status: AC
  Filled 2017-11-04: qty 2

## 2017-11-04 MED ORDER — LISINOPRIL 5 MG PO TABS
2.5000 mg | ORAL_TABLET | Freq: Every day | ORAL | Status: DC
  Administered 2017-11-05: 2.5 mg via ORAL
  Filled 2017-11-04: qty 1

## 2017-11-04 SURGICAL SUPPLY — 20 items
BALLN SAPPHIRE 2.0X15 (BALLOONS) ×2
BALLOON SAPPHIRE 2.0X15 (BALLOONS) IMPLANT
BAND CMPR LRG ZPHR (HEMOSTASIS) ×1
BAND ZEPHYR COMPRESS 30 LONG (HEMOSTASIS) ×1 IMPLANT
CATH IMPULSE 5F ANG/FL3.5 (CATHETERS) ×1 IMPLANT
CATH LAUNCHER 6FR EBU3.5 (CATHETERS) ×1 IMPLANT
COVER PRB 48X5XTLSCP FOLD TPE (BAG) IMPLANT
COVER PROBE 5X48 (BAG) ×2
GUIDEWIRE INQWIRE 1.5J.035X260 (WIRE) IMPLANT
INQWIRE 1.5J .035X260CM (WIRE) ×2
KIT ENCORE 26 ADVANTAGE (KITS) ×1 IMPLANT
KIT HEART LEFT (KITS) ×2 IMPLANT
KIT HEMO VALVE WATCHDOG (MISCELLANEOUS) ×1 IMPLANT
NDL PERC 21GX4CM (NEEDLE) IMPLANT
NEEDLE PERC 21GX4CM (NEEDLE) ×2 IMPLANT
PACK CARDIAC CATHETERIZATION (CUSTOM PROCEDURE TRAY) ×2 IMPLANT
SHEATH RAIN RADIAL 21G 6FR (SHEATH) ×1 IMPLANT
STENT RESOLUTE ONYX 2.0X22 (Permanent Stent) ×1 IMPLANT
TRANSDUCER W/STOPCOCK (MISCELLANEOUS) ×2 IMPLANT
TUBING CIL FLEX 10 FLL-RA (TUBING) ×2 IMPLANT

## 2017-11-04 NOTE — Progress Notes (Signed)
   Subjective: [name] was seen resting comfortably in his room this morning. He denies and pain or shortness of breath today. He is scheduled to go for heart cath today. He has no questions or concerns at this time.  Objective:  Vital signs in last 24 hours: Vitals:   11/03/17 1226 11/03/17 2017 11/04/17 0600 11/04/17 0604  BP: 117/86 118/77 109/67   Pulse: 70 81 70   Resp: 20 18 18    Temp: 98.3 F (36.8 C) 98.1 F (36.7 C) 98.2 F (36.8 C)   TempSrc: Oral Oral Oral   SpO2:  97% 97%   Weight:    188 lb 9.6 oz (85.5 kg)  Height:       Physical Exam  Constitutional: He is oriented to person, place, and time. He appears well-developed and well-nourished. No distress.  HENT:  Head: Normocephalic and atraumatic.  Cardiovascular: Normal rate, regular rhythm, normal heart sounds and intact distal pulses.  Pulmonary/Chest: Effort normal and breath sounds normal. No respiratory distress.  Abdominal: Soft. Bowel sounds are normal. He exhibits no distension. There is no tenderness.  Musculoskeletal: He exhibits no edema or deformity.  Neurological: He is alert and oriented to person, place, and time.  Skin: Skin is warm and dry.   Assessment/Plan: 52yo male with PMH of HTN, GERD, depression, and chronic back pain who presents with chest pain, found to have elevated troponin and EKG with new T wave inversion in lead III, consistent with NSTEMI. Cardiology has been consulted, recommending initiation of IV heparin drip.  NSTEMI: Presented with left-sided CP with typical features, provoked by emotional stress and relieved by NTG. Troponin 0.00>>0.19>>0.50>>6.35>>10.8>>10.3. Repeat EKG with new T-wave inversion in lead III, possible ST elevation in lead V3. UDS negative. D-dimer negative. CBC WNL. Lipid Panel:  HDL 31, LDL 101. Echo showed: EF 60-65%, G2DD, Mild Mitral regurg. TSH WNL. A1c 5.1. - Remains asymptomatic - Cardiac monitoring - Cardiology following, appreciate their  recommendations - Left heart Cath today - SL NTG 0.4mg  q615min PRN for chest pain - IV heparin drip - On Lisinopril combination pill at home, normotensive off, will start 2,5mg  Daily - Aspirin 81mg  daily - High-intensity statin if CAD  Syncope: Unclear etiology, may be related to ACS. No s/s of seizure. No carotid bruits. He did report pre-syncopal likely vasovagal 2/2 ACS..  - No recurrence of symptoms - Management for ACS, as above  Hypokalemia Acute Renal Failure: Cr 1.38 on admission (Baseline 1.2-1.5). K 2.4 on admission and 3.2 on repeat. - Resolved - Cr 1.19 (4/8) - BMP in AM  HTN: Home regimen includes lisinopril-HCTZ 20-12.5mg  daily. Normotensive this admission. - Holding home lisinopril-HCTZ given normotension, can restart if BP elevated - Resume Lisinopril 2.5 daily as patient has been normotensive off home medication but has ACS, will discontinue HCTZ  Chronic back pain: Stable. On Percocet 10-325mg  q6h PRN, [facility], as well as tizanidine 4mg  QHS, and gabapentin. - Continue home gabapentin 1200mg  BID - Continue home Percocet 10-325mg  q6h PRN  Diet: NPO VTE PPx: IV heparin gtt Code Status: Full code   Dispo: Anticipated discharge in approximately 0-1 day(s).   Beola CordMelvin, Alexander, MD 11/04/2017, 6:54 AM Pager: [phone]

## 2017-11-04 NOTE — H&P (View-Only) (Signed)
The patient has been seen in conjunction with Vin Bhagat, PAC. All aspects of care have been considered and discussed. The patient has been personally interviewed, examined, and all clinical data has been reviewed.   Non-ST elevation myocardial infarction.  Currently pain-free.  Hypokalemia has been treated.  To have coronary angiography later today.  Kidney function has improved.  Progress Note  Patient Name: Jose Schultz Date of Encounter: 11/04/2017  Primary Cardiologist: Kristeen MissPhilip Nahser, MD   Subjective   Feeling well. No chest pain, sob or palpitations. For cath today.   Inpatient Medications    Scheduled Meds: . gabapentin  1,200 mg Oral BID  . potassium chloride  20 mEq Oral Daily  . sodium chloride flush  3 mL Intravenous Q12H   Continuous Infusions: . sodium chloride    . sodium chloride 1 mL/kg/hr (11/04/17 0712)  . heparin 1,350 Units/hr (11/04/17 1108)   PRN Meds: sodium chloride, acetaminophen **OR** acetaminophen, famotidine, nitroGLYCERIN, oxyCODONE-acetaminophen **AND** oxyCODONE, senna-docusate, sodium chloride flush   Vital Signs    Vitals:   11/03/17 1226 11/03/17 2017 11/04/17 0600 11/04/17 0604  BP: 117/86 118/77 109/67   Pulse: 70 81 70   Resp: 20 18 18    Temp: 98.3 F (36.8 C) 98.1 F (36.7 C) 98.2 F (36.8 C)   TempSrc: Oral Oral Oral   SpO2:  97% 97%   Weight:    188 lb 9.6 oz (85.5 kg)  Height:        Intake/Output Summary (Last 24 hours) at 11/04/2017 1136 Last data filed at 11/04/2017 1110 Gross per 24 hour  Intake 1529.83 ml  Output 1675 ml  Net -145.17 ml   Filed Weights   11/02/17 2204 11/03/17 0004 11/04/17 0604  Weight: 196 lb 3.4 oz (89 kg) 186 lb 14.4 oz (84.8 kg) 188 lb 9.6 oz (85.5 kg)    Telemetry    SR - Personally Reviewed  ECG    N/A  Physical Exam   GEN: No acute distress.   Neck: No JVD Cardiac: RRR, no murmurs, rubs, or gallops.  Respiratory: Clear to auscultation bilaterally. GI: Soft,  nontender, non-distended  MS: No edema; No deformity. Neuro:  Nonfocal  Psych: Normal affect   Labs    Chemistry Recent Labs  Lab 11/02/17 1750 11/03/17 0433 11/04/17 0613  NA 137 140 141  K 3.4* 3.2* 3.8  CL 106 109 111  CO2 23 21* 23  GLUCOSE 96 120* 98  BUN 10 8 10   CREATININE 1.38* 1.25* 1.19  CALCIUM 8.3* 7.9* 8.2*  PROT  --  4.8*  --   ALBUMIN  --  2.8*  --   AST  --  61*  --   ALT  --  18  --   ALKPHOS  --  60  --   BILITOT  --  0.6  --   GFRNONAA 59* >60 >60  GFRAA >60 >60 >60  ANIONGAP 8 10 7      Hematology Recent Labs  Lab 11/02/17 1750 11/03/17 0433 11/04/17 0613  WBC 6.3 5.6 5.6  RBC 4.78 4.72 4.53  HGB 15.0 14.1 13.6  HCT 42.3 41.8 40.8  MCV 88.5 88.6 90.1  MCH 31.4 29.9 30.0  MCHC 35.5 33.7 33.3  RDW 13.1 13.0 13.3  PLT 192 176 166    Cardiac Enzymes Recent Labs  Lab 11/02/17 2245 11/03/17 0433 11/03/17 1124 11/03/17 1558  TROPONINI 0.50* 6.35* 10.80* 10.37*    Recent Labs  Lab 11/02/17 1755 11/02/17 2103  TROPIPOC 0.00 0.19*    DDimer  Recent Labs  Lab 11/02/17 1750  DDIMER <0.27     Radiology    Dg Chest 2 View  Result Date: 11/02/2017 CLINICAL DATA:  Chest pain today. EXAM: CHEST - 2 VIEW COMPARISON:  January 07, 2017 FINDINGS: The heart size and mediastinal contours are within normal limits. Both lungs are clear. The visualized skeletal structures are unremarkable. IMPRESSION: No active cardiopulmonary disease. Electronically Signed   By: Sherian Rein M.D.   On: 11/02/2017 18:18    Cardiac Studies   Pending cath today  Echo 11/03/17 Study Conclusions  - Left ventricle: The cavity size was normal. Wall thickness was   normal. Systolic function was normal. The estimated ejection   fraction was in the range of 60% to 65%. Wall motion was normal;   there were no regional wall motion abnormalities. Features are   consistent with a pseudonormal left ventricular filling pattern,   with concomitant abnormal relaxation  and increased filling   pressure (grade 2 diastolic dysfunction). - Mitral valve: There was mild regurgitation.  Patient Profile     Jose Schultz is a 52 y.o. male with a hx of hypertension who is being seen for the evaluation of chest discomfort and positive troponin at the request of  Dr. Heide Spark.   Assessment & Plan    1. NSTEMI - Peak of troponin 10.8. Continue IV heparin. Chest pain free. ASA 81mg  given this morning.  11/03/2017: Cholesterol 149; HDL 31; LDL Cholesterol 101; Triglycerides 85; VLDL 17  - IF CAD start statin.  2. HTN - BP stable. Home lisinopril-HCTZ on hold.   For questions or updates, please contact [facility] Please consult [email or web address] for contact info under Cardiology/STEMI.      Lorelei Pont, PA  11/04/2017, 11:36 AM

## 2017-11-04 NOTE — Interval H&P Note (Signed)
Cath Lab Visit (complete for each Cath Lab visit)  Clinical Evaluation Leading to the Procedure:   ACS: Yes.    Non-ACS:    Anginal Classification: CCS IV  Anti-ischemic medical therapy: Minimal Therapy (1 class of medications)  Non-Invasive Test Results: No non-invasive testing performed  Prior CABG: No previous CABG      History and Physical Interval Note:  11/04/2017 3:07 PM  Jose Schultz  has presented today for surgery, with the diagnosis of unstable angina  The various methods of treatment have been discussed with the patient and family. After consideration of risks, benefits and other options for treatment, the patient has consented to  Procedure(s): LEFT HEART CATH AND CORONARY ANGIOGRAPHY (N/A) as a surgical intervention .  The patient's history has been reviewed, patient examined, no change in status, stable for surgery.  I have reviewed the patient's chart and labs.  Questions were answered to the patient's satisfaction.     Lance MussJayadeep Eagan Shifflett

## 2017-11-04 NOTE — Progress Notes (Addendum)
The patient has been seen in conjunction with Vin Bhagat, PAC. All aspects of care have been considered and discussed. The patient has been personally interviewed, examined, and all clinical data has been reviewed.   Non-ST elevation myocardial infarction.  Currently pain-free.  Hypokalemia has been treated.  To have coronary angiography later today.  Kidney function has improved.  Progress Note  Patient Name: Jose Schultz Date of Encounter: 11/04/2017  Primary Cardiologist: Kristeen MissPhilip Nahser, MD   Subjective   Feeling well. No chest pain, sob or palpitations. For cath today.   Inpatient Medications    Scheduled Meds: . gabapentin  1,200 mg Oral BID  . potassium chloride  20 mEq Oral Daily  . sodium chloride flush  3 mL Intravenous Q12H   Continuous Infusions: . sodium chloride    . sodium chloride 1 mL/kg/hr (11/04/17 0712)  . heparin 1,350 Units/hr (11/04/17 1108)   PRN Meds: sodium chloride, acetaminophen **OR** acetaminophen, famotidine, nitroGLYCERIN, oxyCODONE-acetaminophen **AND** oxyCODONE, senna-docusate, sodium chloride flush   Vital Signs    Vitals:   11/03/17 1226 11/03/17 2017 11/04/17 0600 11/04/17 0604  BP: 117/86 118/77 109/67   Pulse: 70 81 70   Resp: 20 18 18    Temp: 98.3 F (36.8 C) 98.1 F (36.7 C) 98.2 F (36.8 C)   TempSrc: Oral Oral Oral   SpO2:  97% 97%   Weight:    188 lb 9.6 oz (85.5 kg)  Height:        Intake/Output Summary (Last 24 hours) at 11/04/2017 1136 Last data filed at 11/04/2017 1110 Gross per 24 hour  Intake 1529.83 ml  Output 1675 ml  Net -145.17 ml   Filed Weights   11/02/17 2204 11/03/17 0004 11/04/17 0604  Weight: 196 lb 3.4 oz (89 kg) 186 lb 14.4 oz (84.8 kg) 188 lb 9.6 oz (85.5 kg)    Telemetry    SR - Personally Reviewed  ECG    N/A  Physical Exam   GEN: No acute distress.   Neck: No JVD Cardiac: RRR, no murmurs, rubs, or gallops.  Respiratory: Clear to auscultation bilaterally. GI: Soft,  nontender, non-distended  MS: No edema; No deformity. Neuro:  Nonfocal  Psych: Normal affect   Labs    Chemistry Recent Labs  Lab 11/02/17 1750 11/03/17 0433 11/04/17 0613  NA 137 140 141  K 3.4* 3.2* 3.8  CL 106 109 111  CO2 23 21* 23  GLUCOSE 96 120* 98  BUN 10 8 10   CREATININE 1.38* 1.25* 1.19  CALCIUM 8.3* 7.9* 8.2*  PROT  --  4.8*  --   ALBUMIN  --  2.8*  --   AST  --  61*  --   ALT  --  18  --   ALKPHOS  --  60  --   BILITOT  --  0.6  --   GFRNONAA 59* >60 >60  GFRAA >60 >60 >60  ANIONGAP 8 10 7      Hematology Recent Labs  Lab 11/02/17 1750 11/03/17 0433 11/04/17 0613  WBC 6.3 5.6 5.6  RBC 4.78 4.72 4.53  HGB 15.0 14.1 13.6  HCT 42.3 41.8 40.8  MCV 88.5 88.6 90.1  MCH 31.4 29.9 30.0  MCHC 35.5 33.7 33.3  RDW 13.1 13.0 13.3  PLT 192 176 166    Cardiac Enzymes Recent Labs  Lab 11/02/17 2245 11/03/17 0433 11/03/17 1124 11/03/17 1558  TROPONINI 0.50* 6.35* 10.80* 10.37*    Recent Labs  Lab 11/02/17 1755 11/02/17 2103  TROPIPOC 0.00 0.19*    DDimer  Recent Labs  Lab 11/02/17 1750  DDIMER <0.27     Radiology    Dg Chest 2 View  Result Date: 11/02/2017 CLINICAL DATA:  Chest pain today. EXAM: CHEST - 2 VIEW COMPARISON:  January 07, 2017 FINDINGS: The heart size and mediastinal contours are within normal limits. Both lungs are clear. The visualized skeletal structures are unremarkable. IMPRESSION: No active cardiopulmonary disease. Electronically Signed   By: Sherian Rein M.D.   On: 11/02/2017 18:18    Cardiac Studies   Pending cath today  Echo 11/03/17 Study Conclusions  - Left ventricle: The cavity size was normal. Wall thickness was   normal. Systolic function was normal. The estimated ejection   fraction was in the range of 60% to 65%. Wall motion was normal;   there were no regional wall motion abnormalities. Features are   consistent with a pseudonormal left ventricular filling pattern,   with concomitant abnormal relaxation  and increased filling   pressure (grade 2 diastolic dysfunction). - Mitral valve: There was mild regurgitation.  Patient Profile     Jose Schultz is a 52 y.o. male with a hx of hypertension who is being seen for the evaluation of chest discomfort and positive troponin at the request of  Dr. Heide Spark.   Assessment & Plan    1. NSTEMI - Peak of troponin 10.8. Continue IV heparin. Chest pain free. ASA 81mg  given this morning.  11/03/2017: Cholesterol 149; HDL 31; LDL Cholesterol 101; Triglycerides 85; VLDL 17  - IF CAD start statin.  2. HTN - BP stable. Home lisinopril-HCTZ on hold.   For questions or updates, please contact [facility] Please consult [email or web address] for contact info under Cardiology/STEMI.      Lorelei Pont, PA  11/04/2017, 11:36 AM

## 2017-11-04 NOTE — Progress Notes (Signed)
ANTICOAGULATION CONSULT NOTE   Pharmacy Consult for heparin Indication: chest pain/ACS  Allergies  Allergen Reactions  . Penicillins Diarrhea    unknown  . Doxycycline     Rash, hives, bumps   . Morphine And Related Other (See Comments)    siezures  . Bee Venom Other (See Comments)  . Other Other (See Comments)    [place]   Patient Measurements: Heparin Dosing Weight: 86 kg  Vital Signs: Temp: 98.2 F (36.8 C) (04/08 0600) Temp Source: Oral (04/08 0600) BP: 109/67 (04/08 0600) Pulse Rate: 70 (04/08 0600)  Labs: Recent Labs    11/02/17 1750  11/03/17 0433 11/03/17 0722 11/03/17 1124 11/03/17 1558 11/04/17 0613  HGB 15.0  --  14.1  --   --   --  13.6  HCT 42.3  --  41.8  --   --   --  40.8  PLT 192  --  176  --   --   --  166  HEPARINUNFRC  --   --   --  0.12*  --  0.33 0.22*  CREATININE 1.38*  --  1.25*  --   --   --  1.19  TROPONINI  --    < > 6.35*  --  10.80* 10.37*  --    < > = values in this interval not displayed.    Medical History: Past Medical History:  Diagnosis Date  . Chronic back pain    spondylolisthesis and radiculopathy  . Depression    hx of-per pt no meds now  . Hypertension   . Muscle spasm of back    takes Flexeril as needed  . Weakness    numbness and tingling both feet   Assessment: 52 yo male admitted with chest pain on heparin. Plans for cath later today. -heparin level is below goal after increase to 1250 units/hr; PT to go to cath at ~1300, will increase rate slightly incase the procedure gets delayed. No bleeding or infusion issues reported.   Goal of Therapy:  Heparin level 0.3-0.7 units/ml Monitor platelets by anticoagulation protocol: Yes   Plan:   -Increase heparin gtt to 1350 units/hr -Daily heparin level and CBC  Ruben Imony Bellah Alia, PharmD Clinical Pharmacist 11/04/2017 10:51 AM

## 2017-11-05 ENCOUNTER — Encounter: Payer: Self-pay | Admitting: Physician Assistant

## 2017-11-05 ENCOUNTER — Encounter (HOSPITAL_COMMUNITY): Payer: Self-pay | Admitting: Interventional Cardiology

## 2017-11-05 DIAGNOSIS — Z880 Allergy status to penicillin: Secondary | ICD-10-CM

## 2017-11-05 DIAGNOSIS — Z95820 Peripheral vascular angioplasty status with implants and grafts: Secondary | ICD-10-CM

## 2017-11-05 DIAGNOSIS — Z91030 Bee allergy status: Secondary | ICD-10-CM

## 2017-11-05 DIAGNOSIS — Z881 Allergy status to other antibiotic agents status: Secondary | ICD-10-CM

## 2017-11-05 DIAGNOSIS — Z885 Allergy status to narcotic agent status: Secondary | ICD-10-CM

## 2017-11-05 DIAGNOSIS — Z91038 Other insect allergy status: Secondary | ICD-10-CM

## 2017-11-05 LAB — BASIC METABOLIC PANEL
Anion gap: 6 (ref 5–15)
BUN: 9 mg/dL (ref 6–20)
CALCIUM: 8.1 mg/dL — AB (ref 8.9–10.3)
CO2: 24 mmol/L (ref 22–32)
CREATININE: 1.39 mg/dL — AB (ref 0.61–1.24)
Chloride: 109 mmol/L (ref 101–111)
GFR calc Af Amer: >60 mL/min (ref >60)
GFR, EST NON AFRICAN AMERICAN: 58 mL/min — AB (ref >60)
GLUCOSE: 75 mg/dL (ref 65–99)
Potassium: 3.4 mmol/L — ABNORMAL LOW (ref 3.5–5.1)
Sodium: 139 mmol/L (ref 135–145)

## 2017-11-05 LAB — CBC
HCT: 40 % (ref 39.0–52.0)
Hemoglobin: 13.8 g/dL (ref 13.0–17.0)
MCH: 31.1 pg (ref 26.0–34.0)
MCHC: 34.5 g/dL (ref 30.0–36.0)
MCV: 90.1 fL (ref 78.0–100.0)
PLATELETS: 159 10*3/uL (ref 150–400)
RBC: 4.44 MIL/uL (ref 4.22–5.81)
RDW: 13 % (ref 11.5–15.5)
WBC: 5.8 10*3/uL (ref 4.0–10.5)

## 2017-11-05 MED ORDER — ROSUVASTATIN CALCIUM 20 MG PO TABS: 40.0000 mg | ORAL_TABLET | Freq: Every day | ORAL | Status: DC

## 2017-11-05 MED ORDER — METOPROLOL SUCCINATE ER 100 MG PO TB24: 100.0000 mg | ORAL_TABLET | Freq: Every day | ORAL | 0 refills | Status: AC

## 2017-11-05 MED ORDER — LISINOPRIL 2.5 MG PO TABS: 2.5000 mg | ORAL_TABLET | Freq: Every day | ORAL | 0 refills | Status: DC

## 2017-11-05 MED ORDER — ROSUVASTATIN CALCIUM 20 MG PO TABS: 20.0000 mg | ORAL_TABLET | Freq: Every day | ORAL | Status: DC

## 2017-11-05 MED ORDER — TICAGRELOR 90 MG PO TABS: 90.0000 mg | ORAL_TABLET | Freq: Two times a day (BID) | ORAL | 0 refills | Status: DC

## 2017-11-05 MED ORDER — METOPROLOL SUCCINATE ER 50 MG PO TB24
100.0000 mg | ORAL_TABLET | Freq: Every day | ORAL | Status: DC
  Administered 2017-11-05: 100 mg via ORAL
  Filled 2017-11-05: qty 2

## 2017-11-05 MED ORDER — ASPIRIN 81 MG PO CHEW: 81.0000 mg | CHEWABLE_TABLET | Freq: Every day | ORAL | 0 refills | Status: AC

## 2017-11-05 MED ORDER — ROSUVASTATIN CALCIUM 20 MG PO TABS: 20.0000 mg | ORAL_TABLET | Freq: Every day | ORAL | 0 refills | Status: DC

## 2017-11-05 MED ORDER — POTASSIUM CHLORIDE CRYS ER 20 MEQ PO TBCR
40.0000 meq | EXTENDED_RELEASE_TABLET | Freq: Once | ORAL | Status: AC
  Administered 2017-11-05: 40 meq via ORAL
  Filled 2017-11-05: qty 2

## 2017-11-05 MED FILL — Tirofiban HCl in NaCl 0.9% IV Soln 5 MG/100ML (Base Equiv): INTRAVENOUS | Qty: 100 | Status: AC

## 2017-11-05 MED FILL — Heparin Sodium (Porcine) 2 Unit/ML in Sodium Chloride 0.9%: INTRAMUSCULAR | Qty: 1000 | Status: AC

## 2017-11-05 NOTE — Progress Notes (Signed)
Cardiology Pre-Discharge Assessment  The patient is asymptomatic.  The right radial cath site does not reveal evidence of hematoma or bleeding.  Creatinine remains at baseline compared to admission, 1.39.  Potassium is 3.4.  EKG this morning is normal.  Digital images from invasive procedure were reviewed.  A small obtuse marginal branch was stented.  Plan discharge today, risk factor modification including smoking cessation, blood pressure control, and intensity statin therapy, and clinical follow-up with Dr. Elease HashimotoNahser.  Potassium will be supplemented prior to discharge.

## 2017-11-05 NOTE — Progress Notes (Signed)
   Subjective: [name] was seen resting comfortably in his bed this morning. He has a successfull LHC on 4/8 [facility] He states he has had no pain or problems since the procedure. He denies chest pain or shortness of breath today. He States he is ready to go home. His new medication regimen was discussed with him and he states he will have no trouble obtaining his medications as he has good insurance. He was counseled on tobacco cessation and he stated that he has already quit smoking  He has no further questions or concerns.  Objective:  Vital signs in last 24 hours: Vitals:   11/04/17 1830 11/04/17 2000 11/04/17 2015 11/04/17 2100  BP: (!) 156/82 (!) 174/72 (!) 150/60 139/74  Pulse: 77 82    Resp: 20 15  12   Temp:  99.5 F (37.5 C)    TempSrc:  Oral    SpO2: 100% 100%    Weight:      Height:       Physical Exam  Constitutional: He is oriented to person, place, and time. He appears well-developed and well-nourished. No distress.  HENT:  Head: Normocephalic and atraumatic.  Cardiovascular: Normal rate, regular rhythm, normal heart sounds and intact distal pulses.  Pulmonary/Chest: Effort normal and breath sounds normal. No respiratory distress.  Abdominal: Soft. Bowel sounds are normal. He exhibits no distension. There is no tenderness.  Musculoskeletal: He exhibits no edema or deformity.  Neurological: He is alert and oriented to person, place, and time.  Skin: Skin is warm and dry.   Assessment/Plan: 52yo male with PMH of HTN, GERD, depression, and chronic back pain who presents with chest pain, found to have elevated troponin and EKG with new T wave inversion in lead III, consistent with NSTEMI.  NSTEMI: Presented with left-sided CP with typical features, provoked by emotional stress and relieved by NTG. Troponin 0.00>>0.19>>0.50>>6.35>>10.8>>10.3. Repeat EKG with new T-wave inversion in lead III, possible ST elevation in lead V3. UDS negative. D-dimer  negative. CBC WNL. Lipid Panel:  HDL 31, LDL 101. Echo showed: EF 60-65%, G2DD, Mild Mitral regurg. TSH WNL. A1c 5.1. - Cardiology following, appreciate their recommendations - Left Heart Cath showed, 100% stenosis of Ost 1st Mrg to 1st Mrg, DES placed - On Lisinopril combination pill at home, normotensive off, will start 2.5mg  Daily  - Aspirin 81mg  and Ticagrelor 90mg  daily (1 yr dual anti-plt 2/2 DES) - Rosuvastatin 20mg  Daily - Metoprolol Succinate 100mg  Daily   Syncope: Resolved. Unclear etiology, may be related to ACS. No s/s of seizure. No carotid bruits. He did report pre-syncopal likely vasovagal 2/2 ACS. - No recurrence of symptoms - Management for ACS, as above  Hypokalemia Acute Renal Failure: Cr 1.38 on admission (Baseline 1.2-1.5). K 2.4 on admission.  - K 3.4 (4/9) > 40mg  KCl PO prior to discharge - Cr 1.39 (4/9) Post Cath  HTN: Home regimen included lisinopril-HCTZ 20-12.5mg  daily. Normotensive to hypertensive this admission. - Lisinopril 2.5 daily as patient has been normotensive off home medication but has ACS, will discontinue HCTZ - Metoprolol Succinate 100mg  Daily  Chronic back pain: Stable. On Percocet 10-325mg  q6h PRN, [facility], as well as tizanidine 4mg  QHS, and gabapentin. - Continue home gabapentin 1200mg  BID - Continue home Percocet 10-325mg  q6h PRN  Diet: Heart Healthy VTE PPx: Lovenox Code Status: Full code   Dispo: Anticipated discharge later today.   Beola CordMelvin, Jose Velie, MD 11/05/2017, 5:55 AM Pager: [phone]

## 2017-11-05 NOTE — Progress Notes (Addendum)
The patient has been seen in conjunction with Laverda PageLindsay Roberts, NP. All aspects of care have been considered and discussed. The patient has been personally interviewed, examined, and all clinical data has been reviewed.   The patient is asymptomatic.  The right radial cath site does not reveal evidence of hematoma or bleeding.  Creatinine remains at baseline compared to admission, 1.39.  Potassium is 3.4.  EKG this morning is normal.  Digital images from invasive procedure were reviewed.  A small obtuse marginal branch was stented.  Plan discharge today, risk factor modification including smoking cessation, blood pressure control, and intensity statin therapy, and clinical follow-up with Dr. Elease HashimotoNahser.  Potassium will be supplemented prior to discharge.              Progress Note  Patient Name: Jose Schultz Date of Encounter: 11/05/2017  Primary Cardiologist: Kristeen MissPhilip Nahser, MD  Subjective   Feeling well, no recurrent chest pain.   Inpatient Medications    Scheduled Meds: . aspirin  81 mg Oral Daily  . enoxaparin (LOVENOX) injection  40 mg Subcutaneous Q24H  . gabapentin  1,200 mg Oral BID  . lisinopril  2.5 mg Oral Daily  . metoprolol succinate  100 mg Oral Daily  . potassium chloride  40 mEq Oral Once  . rosuvastatin  20 mg Oral q1800  . sodium chloride flush  3 mL Intravenous Q12H  . the sensuous heart book   Does not apply Once  . ticagrelor  90 mg Oral BID   Continuous Infusions: . sodium chloride     PRN Meds: sodium chloride, acetaminophen, famotidine, nitroGLYCERIN, ondansetron (ZOFRAN) IV, oxyCODONE-acetaminophen **AND** oxyCODONE, senna-docusate, sodium chloride flush   Vital Signs    Vitals:   11/04/17 2015 11/04/17 2100 11/05/17 0230 11/05/17 0703  BP: (!) 150/60 139/74 138/85 (!) 150/79  Pulse:   76   Resp:  12 19 13   Temp:   98.5 F (36.9 C)   TempSrc:   Oral   SpO2:   99%   Weight:   187 lb 6.3 oz (85 kg)   Height:         Intake/Output Summary (Last 24 hours) at 11/05/2017 0827 Last data filed at 11/05/2017 0600 Gross per 24 hour  Intake 1482.5 ml  Output 3100 ml  Net -1617.5 ml   Filed Weights   11/03/17 0004 11/04/17 0604 11/05/17 0230  Weight: 186 lb 14.4 oz (84.8 kg) 188 lb 9.6 oz (85.5 kg) 187 lb 6.3 oz (85 kg)    Telemetry    SR - Personally Reviewed  ECG    SR - Personally Reviewed  Physical Exam   General: Well developed, well nourished, male appearing in no acute distress. Head: Normocephalic, atraumatic.  Neck: Supple without bruits, JVD. Lungs:  Resp regular and unlabored, CTA. Heart: RRR, S1, S2, no S3, S4, or murmur; no rub. Abdomen: Soft, non-tender, non-distended with normoactive bowel sounds. No hepatomegaly. No rebound/guarding. No obvious abdominal masses. Extremities: No clubbing, cyanosis, edema. Distal pedal pulses are 2+ bilaterally. Right radial cath site stable.  Neuro: Alert and oriented X 3. Moves all extremities spontaneously. Psych: Normal affect.  Labs    Chemistry Recent Labs  Lab 11/03/17 0433 11/04/17 0613 11/05/17 0239  NA 140 141 139  K 3.2* 3.8 3.4*  CL 109 111 109  CO2 21* 23 24  GLUCOSE 120* 98 75  BUN 8 10 9   CREATININE 1.25* 1.19 1.39*  CALCIUM 7.9* 8.2* 8.1*  PROT 4.8*  --   --  ALBUMIN 2.8*  --   --   AST 61*  --   --   ALT 18  --   --   ALKPHOS 60  --   --   BILITOT 0.6  --   --   GFRNONAA >60 >60 58*  GFRAA >60 >60 >60  ANIONGAP 10 7 6      Hematology Recent Labs  Lab 11/03/17 0433 11/04/17 0613 11/05/17 0239  WBC 5.6 5.6 5.8  RBC 4.72 4.53 4.44  HGB 14.1 13.6 13.8  HCT 41.8 40.8 40.0  MCV 88.6 90.1 90.1  MCH 29.9 30.0 31.1  MCHC 33.7 33.3 34.5  RDW 13.0 13.3 13.0  PLT 176 166 159    Cardiac Enzymes Recent Labs  Lab 11/02/17 2245 11/03/17 0433 11/03/17 1124 11/03/17 1558  TROPONINI 0.50* 6.35* 10.80* 10.37*    Recent Labs  Lab 11/02/17 1755 11/02/17 2103  TROPIPOC 0.00 0.19*     BNPNo results for  input(s): BNP, PROBNP in the last 168 hours.   DDimer  Recent Labs  Lab 11/02/17 1750  DDIMER <0.27      Radiology    No results found.  Cardiac Studies   TTE: 11/03/17  Study Conclusions  - Left ventricle: The cavity size was normal. Wall thickness was   normal. Systolic function was normal. The estimated ejection   fraction was in the range of 60% to 65%. Wall motion was normal;   there were no regional wall motion abnormalities. Features are   consistent with a pseudonormal left ventricular filling pattern,   with concomitant abnormal relaxation and increased filling   pressure (grade 2 diastolic dysfunction). - Mitral valve: There was mild regurgitation.  Cath: 11/04/17  Conclusion     Ost Ramus lesion is 25% stenosed.  Mild ectasia in the proximal LAD.  LV end diastolic pressure is mildly elevated.  There is no aortic valve stenosis.  Ost 1st Mrg to 1st Mrg lesion is 100% stenosed.  A drug-eluting stent was successfully placed using a STENT RESOLUTE ONYX 2.0X22.  Post intervention, there is a 0% residual stenosis.   Continue dual antiplatlelet therapy for 1 year, along with aggressive secondary prevention including smoking cessation.   Patient Profile     52 y.o. male with a hx of hypertensionwho was seen for the evaluation of chest discomfort and positive troponin.   Assessment & Plan    1. NSTEMI: Trop peaked at 10.37. Underwent cath with Dr. Eldridge Dace noted above with successful PCI/DES x1 to the 1st OM. Plan for DAPT with ASA/Brilinta for at least one year. On good medical therapy with BB, and ACEi. Worked well with cardiac rehab. Instructions/restrictions post cath given. - continue ASA, statin, BB, ACEi - will arrange follow up in the office  2. HTN: stable with current therapy  3. HL: LDL above goal 101. On high dose statin  4. Hypokalemia: replaced  Signed, Laverda Page, NP  11/05/2017, 8:27 AM  Pager # [phone]   For questions or  updates, please contact [facility] Please consult [email or web address] for contact info under Cardiology/STEMI.

## 2017-11-05 NOTE — Progress Notes (Addendum)
Patient provided with copay cards for Brilinta. Discussed keeping card information on hand prior to giving to pharmacy so he can resource it in the future if needed. Patient knows that he may have to fullfill his medication deductible if one applies to him, and therafter copay will be reduced as low as $5. Patient verbalized understanding for use.       # 1. S/W Angelika Jerrett @ [facility] # [id number]    BRILINITA  90 MH BID  COVER- YES  CO-PAY- ZERO DOLLARS  PRIOR APPROVAL- NO    PREFERRED PHARMACY : YES- [facility], [facility], [facility] AND [facility]

## 2017-11-05 NOTE — Discharge Summary (Signed)
Name: Jose Schultz MRN: [id number] DOB: [birth year] 52 y.o. PCP: [facility]  Date of Admission: 11/02/2017  5:45 PM Date of Discharge: 11/05/2017 Attending Physician: Tyson Alias, *  Discharge Diagnosis:  Principal Problem:   NSTEMI (non-ST elevated myocardial infarction) [facility]) Active Problems:   Back pain  Discharge Medications: Allergies as of 11/05/2017      Reactions   Penicillins Diarrhea   unknown   Doxycycline    Rash, hives, bumps    Morphine And Related Other (See Comments)   siezures   Bee Venom Other (See Comments)   Other Other (See Comments)   [place]      Medication List    STOP taking these medications   lisinopril-hydrochlorothiazide 20-12.5 MG tablet Commonly known as:  PRINZIDE     TAKE these medications   albuterol 108 (90 Base) MCG/ACT inhaler Commonly known as:  PROVENTIL HFA;VENTOLIN HFA Inhale 2 puffs into the lungs every 4 (four) hours as needed for wheezing or shortness of breath.   aspirin 81 MG chewable tablet Chew 1 tablet (81 mg total) by mouth daily.   gabapentin 600 MG tablet Commonly known as:  NEURONTIN Take 1,200 mg by mouth 2 (two) times daily.   ibuprofen 800 MG tablet Commonly known as:  ADVIL,MOTRIN Take 1 tablet (800 mg total) by mouth 3 (three) times daily.   lisinopril 2.5 MG tablet Commonly known as:  PRINIVIL,ZESTRIL Take 1 tablet (2.5 mg total) by mouth daily.   metoprolol succinate 100 MG 24 hr tablet Commonly known as:  TOPROL-XL Take 1 tablet (100 mg total) by mouth daily. Take with or immediately following a meal.   oxyCODONE-acetaminophen 10-325 MG tablet Commonly known as:  PERCOCET Take 1 tablet by mouth 2 (two) times daily as needed for pain. What changed:  when to take this   ranitidine 150 MG tablet Commonly known as:  ZANTAC Take 150 mg by mouth 2 (two) times daily.   rosuvastatin 20 MG tablet Commonly known as:  CRESTOR Take 1 tablet (20 mg total) by  mouth daily at 6 PM.   ticagrelor 90 MG Tabs tablet Commonly known as:  BRILINTA Take 1 tablet (90 mg total) by mouth 2 (two) times daily.   tiZANidine 4 MG tablet Commonly known as:  ZANAFLEX Take 4 mg by mouth 2 (two) times daily.       Disposition and follow-up:   Jose Schultz was discharged from [facility] in Stable condition.  At the hospital follow up visit please address:  NSTEMI: - Discharged on: >> Aspirin 81mg , Ticagrelor 90mg  daily (will need 1 yr dual anti-platelet therapy 2/2 DES) >> Lisinopril 2.5mg  Daily, Metoprolol Succinate 100mg  Daily, and Rosuvastatin 20mg  Daily - Ensure follow up with Cardiology - Patient states he has quit smoking: encourage and assist as needed  Hypokalemia Acute Renal Failure: - BMP at follow to confirm resolution of AKI and Hypokalemia  HTN: - Lisinopril-HCTZ 20-12.5mg  Daily changed to Lisinopril 2.5mg  Daily (due to good control and hypokalemia) - Metoprolol Succinate 100mg  Daily - Titrate Lisinopril as needed  2.  Labs / imaging needed at time of follow-up: BMP  3.  Pending labs/ test needing follow-up: None  Follow-up Appointments:   Hospital Course by problem list:   Syncope NSTEMI: Presented with left-sided chest pain with typical features, provoked by emotional stress and relieved by NTG. Experienced and episode of Syncope in the ED; reported pre-syncopal symptoms; likely vasovagal 2/2 ACS. Troponin peaked at  10.8. Echo showed: EF 60-65%, G2DD, Mild Mitral regurg. Left Heart Cath showed, 100% stenosis of Ost 1st Mrg to 1st Mrg, DES placed (no residual stenosis). Lipid Panel:  HDL 31, LDL 101. TSH WNL. A1c 5.1. Home Lisinopril-HCTZ switched to Lisinopril alone as below. Patient started on Aspirin 81mg  and Ticagrelor 90mg  daily (will need 1 yr dual anti-platelet therapy 2/2 DES). Patient also discharged on Lisinopril 2.5mg  Daily, Metoprolol Succinate 100mg  Daily, and Rosuvastatin 20mg   Daily.  Hypokalemia Acute Renal Failure: Cr 1.38 (Baseline 1.2-1.5) and K 2.4 on admission. Cr initially improved to 1.25, but was elevated again to 1.39 post left heart cath. Potassium 3.4 on day of discharge, Given 40 mEq KCl PO prior to discharge.  HTN: Home regimen included lisinopril-HCTZ 20-12.5mg  daily. He was normotensive to mildly hypertensive while admitted. He was continued on Lisinopril 2.5mg  Daily. HCTZ portion discontinued as patient was well controlled and would be starting beta-blocker. Discharged on Lisinopril 2.5mg  Daily and Metoprolol Succinate 100mg  Daily.  Discharge Vitals:   BP (!) 150/79   Pulse 89   Temp 98.2 F (36.8 C) (Oral)   Resp 13   Ht 5\' 8"  (1.727 m)   Wt 187 lb 6.3 oz (85 kg)   SpO2 98%   BMI 28.49 kg/m   Pertinent Labs, Studies, and Procedures:  CBC Latest Ref Rng & Units 11/05/2017 11/04/2017 11/03/2017  WBC 4.0 - 10.5 K/uL 5.8 5.6 5.6  Hemoglobin 13.0 - 17.0 g/dL 40.9 81.1 91.4  Hematocrit 39.0 - 52.0 % 40.0 40.8 41.8  Platelets 150 - 400 K/uL 159 166 176   BMP Latest Ref Rng & Units 11/05/2017 11/04/2017 11/03/2017  Glucose 65 - 99 mg/dL 75 98 782(N)  BUN 6 - 20 mg/dL 9 10 8   Creatinine 0.61 - 1.24 mg/dL 5.62(Z) 3.08 6.57(Q)  Sodium 135 - 145 mmol/L 139 141 140  Potassium 3.5 - 5.1 mmol/L 3.4(L) 3.8 3.2(L)  Chloride 101 - 111 mmol/L 109 111 109  CO2 22 - 32 mmol/L 24 23 21(L)  Calcium 8.9 - 10.3 mg/dL 8.1(L) 8.2(L) 7.9(L)   Troponin trend: 0.00>>0.19>>0.50>>6.35>>10.8>>10.3.  UDS: negative  D-dimer: negative  Hgb a1c: 5.1  TSH: 0.859  Lipid Panel     Component Value Date/Time   CHOL 149 11/03/2017 0433   TRIG 85 11/03/2017 0433   HDL 31 (L) 11/03/2017 0433   CHOLHDL 4.8 11/03/2017 0433   VLDL 17 11/03/2017 0433   LDLCALC 101 (H) 11/03/2017 0433   Admission EKG  EKG Interpretation  Date/Time:  Saturday November 02 2017 17:43:46 EDT Ventricular Rate:  103 PR Interval:  148 QRS Duration: 70 QT Interval:  338 QTC Calculation: 442 R  Axis:   35 Text Interpretation:  Sinus tachycardia Nonspecific T wave abnormality Abnormal ECG new t wave inversion 3 otherwise similar to prior 3/19 Confirmed by Meridee Score [phone]) on 11/02/2017 6:34:18 PM      Echocardiogram: Study Conclusions - Left ventricle: The cavity size was normal. Wall thickness was normal. Systolic function was normal. The estimated ejection fraction was in the range of 60% to 65%. Wall motion was normal; there were no regional wall motion abnormalities. Features are consistent with a pseudonormal left ventricular filling pattern, with concomitant abnormal relaxation and increased filling pressure (grade 2 diastolic dysfunction). - Mitral valve: There was mild regurgitation.  Left Heart Catheterization: Conclusion   Ost Ramus lesion is 25% stenosed.  Mild ectasia in the proximal LAD.  LV end diastolic pressure is mildly elevated.  There is no aortic valve  stenosis.  Ost 1st Mrg to 1st Mrg lesion is 100% stenosed.  A drug-eluting stent was successfully placed using a STENT RESOLUTE ONYX 2.0X22.  Post intervention, there is a 0% residual stenosis.  Continue dual antiplatlelet therapy for 1 year, along with aggressive secondary prevention including smoking cessation.  DG Chest 2 View: IMPRESSION: No active cardiopulmonary disease.  Discharge Instructions: Discharge Instructions    Call MD for:  difficulty breathing, headache or visual disturbances   Complete by:  As directed    Call MD for:  persistant dizziness or light-headedness   Complete by:  As directed    Call MD for:  persistant nausea and vomiting   Complete by:  As directed    Call MD for:  redness, tenderness, or signs of infection (pain, swelling, redness, odor or green/yellow discharge around incision site)   Complete by:  As directed    Diet - low sodium heart healthy   Complete by:  As directed    Discharge instructions   Complete by:  As directed    Thank you for allowing us to  care for you  Your symptoms were due to a myocardial infarction (heart attack): - The examination of the vessels in your heart showed one that was blocked off and a stent was placed - You will need to be on aspirin and ticagrelor (Brilinta) for 1 year, then just aspirin after that - You will remain on Lisinopril at 2.5mg  Daily - You have been started on Metoprolol 100mg  Daily Twice a day - You have also been started on Rosuvastatin (crestor) for cholesterol and prevention of future heart problems  For your high blood pressure: - Your blood pressure was normal to high while admitted - We have stopped your lisinopril-HCTZ combination pill and STARTed Lisinopril alone 2.5mg  Daily  - We have also added Metoprolol as above, which can lower blood pressure some  Please follow up with your PCP in the next week  Please follow up with cardiology as scheduled by them   Increase activity slowly   Complete by:  As directed       Signed: Beola CordMelvin, Alexander, MD 11/05/2017, 10:09 AM   Pager: [phone]

## 2017-11-05 NOTE — Progress Notes (Signed)
[facility] BAND REMOVAL  LOCATION:    right radial  DEFLATED PER PROTOCOL:    Yes.    TIME BAND OFF / DRESSING APPLIED:    1930   SITE UPON ARRIVAL:    Level 0  SITE AFTER BAND REMOVAL:    Level 0  CIRCULATION SENSATION AND MOVEMENT:    Within Normal Limits   Yes.    COMMENTS:   Pt.tolerated procedure well 

## 2017-11-05 NOTE — Progress Notes (Signed)
CARDIAC REHAB PHASE I   PRE:  Rate/Rhythm: 70 SR    BP: sitting 118/81    SaO2:   MODE:  Ambulation: 1000 ft   POST:  Rate/Rhythm: 88 SR    BP: sitting 126/84     SaO2:   Tolerated very well, eager to go home. 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 Discussed restrictions. Discussed MI, stent, Brilinta, smoking cessation, diet, ex, NTG,  And CRPII. Will send referral to [place] CRPII. He is planning to quit smoking. Gave fake cigarette and resources. Understands the importance of Brilinta. [id number]   Harriet MassonRandi Kristan Lissandra Keil CES, ACSM 11/05/2017 10:17 AM

## 2017-11-05 NOTE — Progress Notes (Signed)
Rec'd a call from RN that pt needs a note for work. 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  He says will come by and pick it up tomorrow.  I will write for him to be out of work till 04/24, he has f/u appt 04/23. Decide then if he needs to be out longer.  Theodore Demarkhonda Jamahl Lemmons, PA-C 11/05/2017 8:01 PM Beeper [phone]

## 2017-11-06 ENCOUNTER — Telehealth (HOSPITAL_COMMUNITY): Payer: Self-pay

## 2017-11-06 NOTE — Telephone Encounter (Signed)
Patients insurance is active and benefits verified through [place] - No co-pay, deductible amount of $200.00/$200.00 has been met, out of pocket amount of $600.00/$600.00 has been met, 30% co-insurance, and no pre-authorization is required. Passport/reference [phone]  Will contact patient to see if interested in the CR program. If interested, patient will need to complete follow up appt. Once completed, patient will be contacted for scheduling upon review by the RN Navigator.

## 2017-11-07 ENCOUNTER — Telehealth (HOSPITAL_COMMUNITY): Payer: Self-pay

## 2017-11-07 NOTE — Telephone Encounter (Signed)
Called patient to see if he is interested in the Cardiac Rehab Program - Patient stated he is interested. Explained scheduling process to patient and he stated he understands. Went over insurance with patient and patient verbally stated he understands. Will contact patient for scheduling once follow up appt has been completed.

## 2017-11-18 NOTE — Progress Notes (Signed)
Cardiology Office Note    Date:  11/19/2017   ID:  Jose Schultz, DOB May 17, 1966, MRN [id number]  PCP:  [facility]  Cardiologist: Dr. Katrinka Blazing  Chief Complaint: Hospital follow up s/p  Cath   History of Present Illness:   Jose Schultz is a 52 y.o. male with hx of HTN, HLD and recent NSTEMI presents for follow up. Initially presented for chest pain. Trop peaked at 10.37. Underwent cath with Dr. Eldridge Dace noted below  with successful PCI/DES x1 to the 1st OM. Plan for DAPT with ASA/Brilinta for at least one year. On good medical therapy with BB, and ACEi. Worked well with cardiac rehab.   Here today for follow up.  He has cut back on cigarettes smoking to 5 cigarettes a day from 1 pack a day.  He has shortness of breath with extreme exertion without chest pain/pressure.  He had a chest pain during admission.  Denies orthopnea, PND, syncope, lower extremity edema, melena or blood in his stool or urine.   Past Medical History:  Diagnosis Date  . Chronic back pain    spondylolisthesis and radiculopathy  . Depression    hx of-per pt no meds now  . Hypertension   . Muscle spasm of back    takes Flexeril as needed  . Weakness    numbness and tingling both feet    Past Surgical History:  Procedure Laterality Date  . BACK SURGERY    . CORONARY STENT INTERVENTION N/A 11/04/2017   Procedure: CORONARY STENT INTERVENTION;  Surgeon: Corky Crafts, MD;  Location: [facility] INVASIVE CV LAB;  Service: Cardiovascular;  Laterality: N/A;  . LEFT HEART CATH AND CORONARY ANGIOGRAPHY N/A 11/04/2017   Procedure: LEFT HEART CATH AND CORONARY ANGIOGRAPHY;  Surgeon: Corky Crafts, MD;  Location: [facility] INVASIVE CV LAB;  Service: Cardiovascular;  Laterality: N/A;  . right 5th finger surgery     in high school    Current Medications: Prior to Admission medications   Medication Sig Start Date End Date Taking? Authorizing Provider  albuterol (PROVENTIL HFA;VENTOLIN HFA)  108 (90 Base) MCG/ACT inhaler Inhale 2 puffs into the lungs every 4 (four) hours as needed for wheezing or shortness of breath. 01/02/17   Hayden Rasmussen, NP  aspirin 81 MG chewable tablet Chew 1 tablet (81 mg total) by mouth daily. 11/05/17   Beola Cord, MD  gabapentin (NEURONTIN) 600 MG tablet Take 1,200 mg by mouth 2 (two) times daily.    [provider]  lisinopril (PRINIVIL,ZESTRIL) 2.5 MG tablet Take 1 tablet (2.5 mg total) by mouth daily. 11/05/17   Beola Cord, MD  metoprolol succinate (TOPROL-XL) 100 MG 24 hr tablet Take 1 tablet (100 mg total) by mouth daily. Take with or immediately following a meal. 11/05/17   Beola Cord, MD  oxyCODONE-acetaminophen (PERCOCET) 10-325 MG per tablet Take 1 tablet by mouth 2 (two) times daily as needed for pain. Patient taking differently: Take 1 tablet by mouth 4 (four) times daily.  08/13/14   Rodolph Bong, MD  ranitidine (ZANTAC) 150 MG tablet Take 150 mg by mouth 2 (two) times daily.    [provider]  rosuvastatin (CRESTOR) 20 MG tablet Take 1 tablet (20 mg total) by mouth daily at 6 PM. 11/05/17   Beola Cord, MD  ticagrelor (BRILINTA) 90 MG TABS tablet Take 1 tablet (90 mg total) by mouth 2 (two) times daily. 11/05/17   Beola Cord, MD  tiZANidine (ZANAFLEX) 4 MG tablet  Take 4 mg by mouth 2 (two) times daily. 10/29/17   [provider]    Allergies:   Penicillins; Doxycycline; Morphine and related; Bee venom; and Other   Social History   Socioeconomic History  . Marital status: Single    Spouse name: Not on file  . Number of children: Not on file  . Years of education: Not on file  . Highest education level: Not on file  Occupational History    Employer: [employer]  Social Needs  . Financial resource strain: Not on file  . Food insecurity:    Worry: Not on file    Inability: Not on file  . Transportation needs:    Medical: Not on file    Non-medical: Not on file  Tobacco Use   . Smoking status: Current Every Day Smoker    Packs/day: 0.50    Years: 32.00    Pack years: 16.00    Types: Cigarettes  . Smokeless tobacco: Never Used  Substance and Sexual Activity  . Alcohol use: No  . Drug use: Not Currently    Types: "Crack" cocaine    Comment: denies anything in past yr  . Sexual activity: Not Currently  Lifestyle  . Physical activity:    Days per week: Not on file    Minutes per session: Not on file  . Stress: Not on file  Relationships  . Social connections:    Talks on phone: Not on file    Gets together: Not on file    Attends religious service: Not on file    Active member of club or organization: Not on file    Attends meetings of clubs or organizations: Not on file    Relationship status: Not on file  Other Topics Concern  . Not on file  Social History Narrative  . Not on file     Family History:  The patient's family history includes Hypertension in his mother; Kidney failure in his mother.   ROS:   Please see the history of present illness.    ROS All other systems reviewed and are negative.   PHYSICAL EXAM:   VS:  BP 128/84   Pulse 64   Ht 5\' 8"  (1.727 m)   Wt 192 lb (87.1 kg)   BMI 29.19 kg/m    GEN: Well nourished, well developed, in no acute distress  HEENT: normal  Neck: no JVD, carotid bruits, or masses Cardiac: RRR; no murmurs, rubs, or gallops,no edema.  Right radial cath site without hematoma Respiratory:  clear to auscultation bilaterally, normal work of breathing GI: soft, nontender, nondistended, + BS MS: no deformity or atrophy  Skin: warm and dry, no rash Neuro:  Alert and Oriented x 3, Strength and sensation are intact Psych: euthymic mood, full affect  Wt Readings from Last 3 Encounters:  11/19/17 192 lb (87.1 kg)  11/05/17 187 lb 6.3 oz (85 kg)  10/15/17 190 lb (86.2 kg)      Studies/Labs Reviewed:   EKG:  EKG is not ordered today.   Recent Labs: 11/03/2017: ALT 18 11/04/2017: TSH 0.859 11/05/2017: BUN  9; Creatinine, Ser 1.39; Hemoglobin 13.8; Platelets 159; Potassium 3.4; Sodium 139   Lipid Panel    Component Value Date/Time   CHOL 149 11/03/2017 0433   TRIG 85 11/03/2017 0433   HDL 31 (L) 11/03/2017 0433   CHOLHDL 4.8 11/03/2017 0433   VLDL 17 11/03/2017 0433   LDLCALC 101 (H) 11/03/2017 1610  Additional studies/ records that were reviewed today include:   TTE: 11/03/17  Study Conclusions  - Left ventricle: The cavity size was normal. Wall thickness was normal. Systolic function was normal. The estimated ejection fraction was in the range of 60% to 65%. Wall motion was normal; there were no regional wall motion abnormalities. Features are consistent with a pseudonormal left ventricular filling pattern, with concomitant abnormal relaxation and increased filling pressure (grade 2 diastolic dysfunction). - Mitral valve: There was mild regurgitation.  Cath: 11/04/17  Conclusion     Ost Ramus lesion is 25% stenosed.  Mild ectasia in the proximal LAD.  LV end diastolic pressure is mildly elevated.  There is no aortic valve stenosis.  Ost 1st Mrg to 1st Mrg lesion is 100% stenosed.  A drug-eluting stent was successfully placed using a STENT RESOLUTE ONYX 2.0X22.  Post intervention, there is a 0% residual stenosis.  Continue dual antiplatlelet therapy for 1 year, along with aggressive secondary prevention including smoking cessation.    ASSESSMENT & PLAN:    1. CAD s/p DES to OM 1st - No recurrent angina pain. His DOE is likely related to smoking. Never required any SL nitro. He will let us know if worsening of symptoms or no improvement. Continue ASA, Brillinta, statin and BB.   2.HLD - 11/03/2017: Cholesterol 149; HDL 31; LDL Cholesterol 101; Triglycerides 85; VLDL 17  - Continue lipitor. LFTS and lipid panel in 4 weeks.  3. HTN - Stable on current medications.   4. Tobacco smoking - Cessation advised. Trial of nicotine patch. He wants to  quit.    Medication Adjustments/Labs and Tests Ordered: Current medicines are reviewed at length with the patient today.  Concerns regarding medicines are outlined above.  Medication changes, Labs and Tests ordered today are listed in the Patient Instructions below. Patient Instructions  Medication Instructions:   . START USING NICODERM PATCH  AS INSTRUCTED  ONCE DAILY  FOR SMOKING    If you need a refill on your cardiac medications before your next appointment, please call your pharmacy.  Labwork:   RETURN  FOR 4 WEEKS   FASTING  LIPID AND LIVER    Testing/Procedures:  NONE ORDERED  TODAY    Follow-Up:  IN 4 MONTHS WITH DR Katrinka BlazingSMITH    Any Other Special Instructions Will Be Listed Below (If Applicable).                                                                                                                                                      Lorelei PontSigned, Kadejah Sandiford, [place]  11/19/2017 11:41 AM    [facility] [address], [place], [place]  [address] Phone: [phone]; Fax: [phone]

## 2017-11-19 ENCOUNTER — Encounter: Payer: Self-pay | Admitting: *Deleted

## 2017-11-19 ENCOUNTER — Ambulatory Visit: Payer: BLUE CROSS/BLUE SHIELD | Admitting: Physician Assistant

## 2017-11-19 VITALS — BP 128/84 | HR 64 | Ht 68.0 in | Wt 192.0 lb

## 2017-11-19 DIAGNOSIS — I10 Essential (primary) hypertension: Secondary | ICD-10-CM

## 2017-11-19 DIAGNOSIS — E785 Hyperlipidemia, unspecified: Secondary | ICD-10-CM | POA: Diagnosis not present

## 2017-11-19 DIAGNOSIS — I25110 Atherosclerotic heart disease of native coronary artery with unstable angina pectoris: Secondary | ICD-10-CM | POA: Diagnosis not present

## 2017-11-19 DIAGNOSIS — Z720 Tobacco use: Secondary | ICD-10-CM | POA: Diagnosis not present

## 2017-11-19 MED ORDER — NICOTINE 7 MG/24HR TD PT24: 7.0000 mg | MEDICATED_PATCH | Freq: Every day | TRANSDERMAL | 0 refills | Status: DC

## 2017-11-19 NOTE — Patient Instructions (Addendum)
Medication Instructions:   . START USING NICODERM PATCH  AS INSTRUCTED  ONCE DAILY  FOR SMOKING    If you need a refill on your cardiac medications before your next appointment, please call your pharmacy.  Labwork:   RETURN   IN 4 WEEKS   FASTING  LIPID AND LIVER    Testing/Procedures:  NONE ORDERED  TODAY    Follow-Up:  IN 4 MONTHS WITH DR Katrinka BlazingSMITH    Any Other Special Instructions Will Be Listed Below (If Applicable).

## 2017-11-29 ENCOUNTER — Telehealth (HOSPITAL_COMMUNITY): Payer: Self-pay

## 2017-11-29 NOTE — Telephone Encounter (Signed)
Attempted to call patient in regards to Cardiac Rehab - lm on vm °

## 2017-12-19 ENCOUNTER — Other Ambulatory Visit: Payer: BLUE CROSS/BLUE SHIELD | Admitting: *Deleted

## 2017-12-19 ENCOUNTER — Encounter (INDEPENDENT_AMBULATORY_CARE_PROVIDER_SITE_OTHER): Payer: Self-pay

## 2017-12-19 DIAGNOSIS — E785 Hyperlipidemia, unspecified: Secondary | ICD-10-CM

## 2017-12-19 LAB — LIPID PANEL
CHOL/HDL RATIO: 2.8 ratio (ref 0.0–5.0)
CHOLESTEROL TOTAL: 91 mg/dL — AB (ref 100–199)
HDL: 32 mg/dL — ABNORMAL LOW (ref >39)
LDL Calculated: 46 mg/dL (ref 0–99)
TRIGLYCERIDES: 65 mg/dL (ref 0–149)
VLDL Cholesterol Cal: 13 mg/dL (ref 5–40)

## 2017-12-19 LAB — HEPATIC FUNCTION PANEL
ALT: 24 IU/L (ref 0–44)
AST: 26 IU/L (ref 0–40)
Albumin: 4.1 g/dL (ref 3.5–5.5)
Alkaline Phosphatase: 95 IU/L (ref 39–117)
BILIRUBIN, DIRECT: 0.11 mg/dL (ref 0.00–0.40)
Bilirubin Total: 0.3 mg/dL (ref 0.0–1.2)
Total Protein: 6.5 g/dL (ref 6.0–8.5)

## 2017-12-20 NOTE — Progress Notes (Signed)
Pt has been made aware of normal result and verbalized understanding.  jw 12/20/17

## 2017-12-31 ENCOUNTER — Telehealth (HOSPITAL_COMMUNITY): Payer: Self-pay

## 2017-12-31 NOTE — Telephone Encounter (Signed)
Called patient in regards to Cardiac Rehab - Scheduled orientation on 02/13/2018 at 7:30am. Patient will attend the 6:45am exc class. Mailed packet.

## 2018-01-27 ENCOUNTER — Telehealth (HOSPITAL_COMMUNITY): Payer: Self-pay | Admitting: Pharmacist

## 2018-01-27 NOTE — Telephone Encounter (Signed)
Cardiac Rehab Medication Review by a Pharmacist  Does the patient  feel that his/her medications are working for him/her?  yes  Has the patient been experiencing any side effects to the medications prescribed?  no  Does the patient measure his/her own blood pressure or blood glucose at home?  yes   Does the patient have any problems obtaining medications due to transportation or finances?   no  Understanding of regimen: fair Understanding of indications: fair Potential of compliance: good    Pharmacist comments: Patient stated Nicotine prescription is discontinued, started now on Chantix, twice daily. Patient unsure of Chantix dose. Patient monitors blood pressure daily, adherent to medications, uses a pill box to specify twice daily vs once daily dosing.    Jose BaasJoshua J Schultz 01/27/2018 10:48 AM

## 2018-01-28 ENCOUNTER — Encounter (HOSPITAL_COMMUNITY): Payer: Self-pay

## 2018-02-04 ENCOUNTER — Inpatient Hospital Stay (HOSPITAL_COMMUNITY)
Admission: RE | Admit: 2018-02-04 | Discharge: 2018-02-04 | Disposition: A | Discharge status: Home / Self Care | Payer: Self-pay | Source: Ambulatory Visit

## 2018-02-04 DIAGNOSIS — Z955 Presence of coronary angioplasty implant and graft: Secondary | ICD-10-CM

## 2018-02-04 DIAGNOSIS — I214 Non-ST elevation (NSTEMI) myocardial infarction: Secondary | ICD-10-CM

## 2018-02-12 ENCOUNTER — Ambulatory Visit (HOSPITAL_COMMUNITY): Payer: Self-pay

## 2018-02-13 ENCOUNTER — Ambulatory Visit (HOSPITAL_COMMUNITY): Payer: Self-pay

## 2018-02-14 ENCOUNTER — Ambulatory Visit (HOSPITAL_COMMUNITY): Payer: Self-pay

## 2018-02-17 ENCOUNTER — Ambulatory Visit (HOSPITAL_COMMUNITY): Payer: Self-pay

## 2018-02-19 ENCOUNTER — Ambulatory Visit (HOSPITAL_COMMUNITY): Payer: Self-pay

## 2018-02-21 ENCOUNTER — Ambulatory Visit (HOSPITAL_COMMUNITY): Payer: Self-pay

## 2018-02-24 ENCOUNTER — Ambulatory Visit (HOSPITAL_COMMUNITY): Payer: Self-pay

## 2018-02-26 ENCOUNTER — Ambulatory Visit (HOSPITAL_COMMUNITY): Payer: Self-pay

## 2018-02-28 ENCOUNTER — Ambulatory Visit (HOSPITAL_COMMUNITY): Payer: Self-pay

## 2018-03-03 ENCOUNTER — Ambulatory Visit (HOSPITAL_COMMUNITY): Payer: Self-pay

## 2018-03-05 ENCOUNTER — Ambulatory Visit (HOSPITAL_COMMUNITY): Payer: Self-pay

## 2018-03-07 ENCOUNTER — Ambulatory Visit (HOSPITAL_COMMUNITY): Payer: Self-pay

## 2018-03-10 ENCOUNTER — Telehealth (HOSPITAL_COMMUNITY): Payer: Self-pay

## 2018-03-10 ENCOUNTER — Ambulatory Visit (HOSPITAL_COMMUNITY): Payer: Self-pay

## 2018-03-10 NOTE — Telephone Encounter (Signed)
Cardiac Rehab Medication Review by a Pharmacist  Called patient to do medication review for upcoming Cardiac Rehab appointment on 8/20. When appointment was mentioned to patient, he said that his work hours had just recently changed and that he would no longer be able to make it to his appointment. Encouraged patient to reschedule appointment as soon as possible and patient was agreeable to this.  Arvilla MarketMelissa Lore, PharmD PGY1 Pharmacy Resident Phone [phone] 03/10/2018     4:24 PM

## 2018-03-12 ENCOUNTER — Ambulatory Visit (HOSPITAL_COMMUNITY): Payer: Self-pay

## 2018-03-14 ENCOUNTER — Ambulatory Visit (HOSPITAL_COMMUNITY): Payer: Self-pay

## 2018-03-17 ENCOUNTER — Ambulatory Visit (HOSPITAL_COMMUNITY): Payer: Self-pay

## 2018-03-17 ENCOUNTER — Telehealth (HOSPITAL_COMMUNITY): Payer: Self-pay

## 2018-03-17 NOTE — Telephone Encounter (Signed)
Attempted to contact patient to reschedule orientation coming up tomorrow on 03/18/18. Per pharmacist due to work schedule, he will need to reschedule - lm on vm

## 2018-03-18 ENCOUNTER — Ambulatory Visit (HOSPITAL_COMMUNITY): Payer: Self-pay

## 2018-03-18 ENCOUNTER — Ambulatory Visit: Payer: BLUE CROSS/BLUE SHIELD | Admitting: Interventional Cardiology

## 2018-03-19 ENCOUNTER — Ambulatory Visit (HOSPITAL_COMMUNITY): Payer: Self-pay

## 2018-03-21 ENCOUNTER — Ambulatory Visit (HOSPITAL_COMMUNITY): Payer: Self-pay

## 2018-03-24 ENCOUNTER — Ambulatory Visit (HOSPITAL_COMMUNITY): Payer: Self-pay

## 2018-03-26 ENCOUNTER — Ambulatory Visit (HOSPITAL_COMMUNITY): Payer: Self-pay

## 2018-03-28 ENCOUNTER — Ambulatory Visit (HOSPITAL_COMMUNITY): Payer: Self-pay

## 2018-04-02 ENCOUNTER — Ambulatory Visit (HOSPITAL_COMMUNITY): Payer: Self-pay

## 2018-04-04 ENCOUNTER — Ambulatory Visit (HOSPITAL_COMMUNITY): Payer: Self-pay

## 2018-04-07 ENCOUNTER — Ambulatory Visit (HOSPITAL_COMMUNITY): Payer: Self-pay

## 2018-04-09 ENCOUNTER — Ambulatory Visit (HOSPITAL_COMMUNITY): Payer: Self-pay

## 2018-04-11 ENCOUNTER — Ambulatory Visit (HOSPITAL_COMMUNITY): Payer: Self-pay

## 2018-04-14 ENCOUNTER — Ambulatory Visit (HOSPITAL_COMMUNITY): Payer: Self-pay

## 2018-04-15 ENCOUNTER — Telehealth (HOSPITAL_COMMUNITY): Payer: Self-pay

## 2018-04-15 NOTE — Telephone Encounter (Signed)
Called to follow up with patient in regards to Cardiac Rehab - patient stated he will need to hold off on Cardiac Rehab for awhile as he is having a lot of trouble walking. Patient stated he will call when and if he is ready. Closed referral.

## 2018-04-16 ENCOUNTER — Ambulatory Visit (HOSPITAL_COMMUNITY): Payer: Self-pay

## 2018-04-18 ENCOUNTER — Ambulatory Visit (HOSPITAL_COMMUNITY): Payer: Self-pay

## 2018-04-21 ENCOUNTER — Ambulatory Visit (HOSPITAL_COMMUNITY): Payer: Self-pay

## 2018-04-23 ENCOUNTER — Ambulatory Visit (HOSPITAL_COMMUNITY): Payer: Self-pay

## 2018-04-25 ENCOUNTER — Ambulatory Visit (HOSPITAL_COMMUNITY): Payer: Self-pay

## 2018-04-28 ENCOUNTER — Ambulatory Visit (HOSPITAL_COMMUNITY): Payer: Self-pay

## 2018-04-30 ENCOUNTER — Ambulatory Visit (HOSPITAL_COMMUNITY): Payer: Self-pay

## 2018-05-02 ENCOUNTER — Ambulatory Visit (HOSPITAL_COMMUNITY): Payer: Self-pay

## 2018-05-05 ENCOUNTER — Ambulatory Visit (HOSPITAL_COMMUNITY): Payer: Self-pay

## 2018-05-07 ENCOUNTER — Ambulatory Visit (HOSPITAL_COMMUNITY): Payer: Self-pay

## 2018-05-09 ENCOUNTER — Ambulatory Visit (HOSPITAL_COMMUNITY): Payer: Self-pay

## 2018-05-12 ENCOUNTER — Ambulatory Visit (HOSPITAL_COMMUNITY): Payer: Self-pay

## 2018-05-14 ENCOUNTER — Ambulatory Visit (HOSPITAL_COMMUNITY): Payer: Self-pay

## 2018-05-16 ENCOUNTER — Ambulatory Visit (HOSPITAL_COMMUNITY): Payer: Self-pay

## 2018-05-19 ENCOUNTER — Ambulatory Visit (HOSPITAL_COMMUNITY): Payer: Self-pay

## 2018-05-21 ENCOUNTER — Ambulatory Visit (HOSPITAL_COMMUNITY): Payer: Self-pay

## 2018-05-21 NOTE — Progress Notes (Signed)
Cardiology Office Note:    Date:  05/22/2018   ID:  Jose Schultz, DOB 08-06-1965, MRN [id number]  PCP:  [facility]  Cardiologist:  Lesleigh Noe, MD   Referring MD: [facility]   Chief Complaint  Patient presents with  . Coronary Artery Disease    History of Present Illness:    Jose Schultz is a 52 y.o. male with a hx of HTN, HLD,  NSTEMI, and  successful PCI/DES x1 to the 1st OM.   There have been no significant cardiac events since his myocardial infarction in April.  He denies angina.  He has not needed sublingual nitroglycerin.  No episodes of chest pain.  He occasionally has brief palpitations.  He can have sudden episodes of shortness of breath.  He feels is related to COPD.  It is more likely somewhat influenced by Brilinta.  I will switch from Brilinta to clopidogrel.  Clopidogrel will be continued for 6 months and then discontinued.  He denies claudication but is not able to walk very much because of recent injury to his back.  He is compliant with the medication regimen as listed.  He continues to smoke cigarettes heavily.  Past Medical History:  Diagnosis Date  . Chronic back pain    spondylolisthesis and radiculopathy  . Depression    hx of-per pt no meds now  . Hypertension   . Muscle spasm of back    takes Flexeril as needed  . Weakness    numbness and tingling both feet    Past Surgical History:  Procedure Laterality Date  . BACK SURGERY    . CORONARY STENT INTERVENTION N/A 11/04/2017   Procedure: CORONARY STENT INTERVENTION;  Surgeon: Corky Crafts, MD;  Location: [facility] INVASIVE CV LAB;  Service: Cardiovascular;  Laterality: N/A;  . LEFT HEART CATH AND CORONARY ANGIOGRAPHY N/A 11/04/2017   Procedure: LEFT HEART CATH AND CORONARY ANGIOGRAPHY;  Surgeon: Corky Crafts, MD;  Location: [facility] INVASIVE CV LAB;  Service: Cardiovascular;  Laterality: N/A;  . right 5th finger surgery     in high school     Current Medications: Current Meds  Medication Sig  . albuterol (PROVENTIL HFA;VENTOLIN HFA) 108 (90 Base) MCG/ACT inhaler Inhale 2 puffs into the lungs every 4 (four) hours as needed for wheezing or shortness of breath.  Marland Kitchen aspirin 81 MG chewable tablet Chew 1 tablet (81 mg total) by mouth daily.  Marland Kitchen gabapentin (NEURONTIN) 300 MG capsule Take 300 mg by mouth 3 (three) times daily.  Marland Kitchen lisinopril (PRINIVIL,ZESTRIL) 2.5 MG tablet Take 1 tablet (2.5 mg total) by mouth daily.  . metoprolol succinate (TOPROL-XL) 100 MG 24 hr tablet Take 1 tablet (100 mg total) by mouth daily. Take with or immediately following a meal.  . oxyCODONE-acetaminophen (PERCOCET) 10-325 MG per tablet Take 1 tablet by mouth 2 (two) times daily as needed for pain. (Patient taking differently: Take 1 tablet by mouth 4 (four) times daily. )  . ranitidine (ZANTAC) 150 MG tablet Take 150 mg by mouth 2 (two) times daily.  . rosuvastatin (CRESTOR) 20 MG tablet Take 1 tablet (20 mg total) by mouth daily at 6 PM.  . ticagrelor (BRILINTA) 90 MG TABS tablet Take 1 tablet (90 mg total) by mouth 2 (two) times daily.  Marland Kitchen tiZANidine (ZANAFLEX) 4 MG tablet Take 4 mg by mouth 2 (two) times daily.  . [DISCONTINUED] nitroGLYCERIN (NITROSTAT) 0.3 MG SL tablet Place under the tongue.  Allergies:   Penicillins; Doxycycline; Morphine and related; Bee venom; and Other   Social History   Socioeconomic History  . Marital status: Single    Spouse name: Not on file  . Number of children: Not on file  . Years of education: Not on file  . Highest education level: Not on file  Occupational History    Employer: [employer]  Social Needs  . Financial resource strain: Not on file  . Food insecurity:    Worry: Not on file    Inability: Not on file  . Transportation needs:    Medical: Not on file    Non-medical: Not on file  Tobacco Use  . Smoking status: Former Smoker    Packs/day: 0.50    Years: 32.00    Pack years:  16.00    Types: Cigarettes    Last attempt to quit: 11/04/2017    Years since quitting: 0.5  . Smokeless tobacco: Never Used  Substance and Sexual Activity  . Alcohol use: No  . Drug use: Not Currently    Types: "Crack" cocaine    Comment: denies anything in past yr  . Sexual activity: Not Currently  Lifestyle  . Physical activity:    Days per week: Not on file    Minutes per session: Not on file  . Stress: Not on file  Relationships  . Social connections:    Talks on phone: Not on file    Gets together: Not on file    Attends religious service: Not on file    Active member of club or organization: Not on file    Attends meetings of clubs or organizations: Not on file    Relationship status: Not on file  Other Topics Concern  . Not on file  Social History Narrative  . Not on file     Family History: The patient's family history includes Hypertension in his mother; Kidney failure in his mother.  ROS:   Please see the history of present illness.    Significant back pain.  He is in rehab because of her recent rupture of L4-L5.  He may need to have back surgery.  He is unable to exercise.  He has sudden waves of shortness of breath which he feels is related to COPD.  He feels like there is wheezing.  He has been placed on an inhaler.  He has some shortness of breath when he lies down.  He has had an unexplained weight gain.  Overall activity has decreased.  Has some leg pain associated with his back.  Some difficulty urinating all other systems reviewed and are negative.  EKGs/Labs/Other Studies Reviewed:    The following studies were reviewed today: Coronary Angiography April 2019: Diagnostic Diagram       Post-Intervention Diagram        2D Doppler echocardiogram April 2019: Study Conclusions  - Left ventricle: The cavity size was normal. Wall thickness was   normal. Systolic function was normal. The estimated ejection   fraction was in the range of 60% to 65%.  Wall motion was normal;   there were no regional wall motion abnormalities. Features are   consistent with a pseudonormal left ventricular filling pattern,   with concomitant abnormal relaxation and increased filling   pressure (grade 2 diastolic dysfunction). - Mitral valve: There was mild regurgitation.  EKG:  EKG is not ordered today.  Recent Labs: 11/04/2017: TSH 0.859 11/05/2017: BUN 9; Creatinine, Ser 1.39; Hemoglobin 13.8; Platelets  159; Potassium 3.4; Sodium 139 12/19/2017: ALT 24  Recent Lipid Panel    Component Value Date/Time   CHOL 91 (L) 12/19/2017 0847   TRIG 65 12/19/2017 0847   HDL 32 (L) 12/19/2017 0847   CHOLHDL 2.8 12/19/2017 0847   CHOLHDL 4.8 11/03/2017 0433   VLDL 17 11/03/2017 0433   LDLCALC 46 12/19/2017 0847    Physical Exam:    VS:  BP 112/78   Pulse 65   Ht 5\' 8"  (1.727 m)   Wt 208 lb 6.4 oz (94.5 kg)   BMI 31.69 kg/m     Wt Readings from Last 3 Encounters:  05/22/18 208 lb 6.4 oz (94.5 kg)  11/19/17 192 lb (87.1 kg)  11/05/17 187 lb 6.3 oz (85 kg)     GEN: Smells of cigarette smoke.  Well nourished, well developed in no acute distress HEENT: Normal NECK: No JVD. LYMPHATICS: No lymphadenopathy CARDIAC: RRR, no murmur, no gallop, no edema. VASCULAR: 2+ bilateral radial and carotid pulses.  No bruits. RESPIRATORY:  Clear to auscultation without rales, wheezing or rhonchi  ABDOMEN: Soft, non-tender, non-distended, No pulsatile mass, MUSCULOSKELETAL: No deformity  SKIN: Warm and dry NEUROLOGIC:  Alert and oriented x 3 PSYCHIATRIC:  Normal affect   ASSESSMENT:    1. Coronary artery disease involving native coronary artery of native heart with unstable angina pectoris (HCC)   2. Hyperlipidemia, unspecified hyperlipidemia type   3. Essential hypertension   4. Tobacco abuse    PLAN:    In order of problems listed above:  1. Change to clopidogrel 75 mg/day.  Discontinue Brilinta when current supply is used.  We discussed secondary risk  prevention.  He item in his case is smoking cessation which has not occurred.  Lipids adequate.  He has not had any glycemic control issues.  Blood pressure is excellent.  Another unmet target is moderate aerobic activity which at this time is being limited by recent back injury. 2. LDL target less than 70 mg/dL.  Continue moderate intensity statin therapy.  Liver and lipid panel in April. 3. Blood pressure target less than 130/80 mmHg is being achieved. 4. Smoking cessation again discussed.  He is unable to commit to a stop date.  High risk for recurrent events.  This was discussed with the patient in a frank conversation.  Smoking cessation is mandatory.  He is unable to do it at this time because of the stress associated with his back injury.  He is in physical therapy.  Unable to achieve moderate aerobic activity because of back pain.  Anticipates another back surgery.  We have not refill nitroglycerin.  We will switch to Plavix from Brilinta.  Brilinta is contributing to some of his shortness of breath.  Greater than 50% of the time during this office visit was spent in education, counseling, and coordination of care related to underlying disease process and testing as outlined.    Medication Adjustments/Labs and Tests Ordered: Current medicines are reviewed at length with the patient today.  Concerns regarding medicines are outlined above.  No orders of the defined types were placed in this encounter.  No orders of the defined types were placed in this encounter.   There are no Patient Instructions on file for this visit.   Signed, Lesleigh Noe, MD  05/22/2018 9:37 AM    [facility]

## 2018-05-22 ENCOUNTER — Ambulatory Visit: Payer: BLUE CROSS/BLUE SHIELD | Admitting: Interventional Cardiology

## 2018-05-22 ENCOUNTER — Encounter: Payer: Self-pay | Admitting: Interventional Cardiology

## 2018-05-22 VITALS — BP 112/78 | HR 65 | Ht 68.0 in | Wt 208.4 lb

## 2018-05-22 DIAGNOSIS — I25110 Atherosclerotic heart disease of native coronary artery with unstable angina pectoris: Secondary | ICD-10-CM

## 2018-05-22 DIAGNOSIS — Z720 Tobacco use: Secondary | ICD-10-CM | POA: Diagnosis not present

## 2018-05-22 DIAGNOSIS — I10 Essential (primary) hypertension: Secondary | ICD-10-CM

## 2018-05-22 DIAGNOSIS — E785 Hyperlipidemia, unspecified: Secondary | ICD-10-CM

## 2018-05-22 MED ORDER — CLOPIDOGREL BISULFATE 75 MG PO TABS: 75.0000 mg | ORAL_TABLET | Freq: Every day | ORAL | 3 refills | Status: DC

## 2018-05-22 NOTE — Patient Instructions (Signed)
Medication Instructions:  1) DISCONTINUE Brilinta 2) START Plavix 75mg  once daily  If you need a refill on your cardiac medications before your next appointment, please call your pharmacy.   Lab work: Your physician recommends that you return for lab work at the time of your follow up appointment (liver, lipid).  Make sure you are fasting for these labs (nothing to eat or drink after midnight except water or black coffee).  If you have labs (blood work) drawn today and your tests are completely normal, you will receive your results only by: Marland Kitchen MyChart Message (if you have MyChart) OR . A paper copy in the mail If you have any lab test that is abnormal or we need to change your treatment, we will call you to review the results.  Testing/Procedures: None  Follow-Up: Your physician recommends that you schedule a follow-up appointment in: April or May with a NP on Dr. Michaelle Copas team.   Any Other Special Instructions Will Be Listed Below (If Applicable).   Steps to Quit Smoking Smoking tobacco can be bad for your health. It can also affect almost every organ in your body. Smoking puts you and people around you at risk for many serious long-lasting (chronic) diseases. Quitting smoking is hard, but it is one of the best things that you can do for your health. It is never too late to quit. What are the benefits of quitting smoking? When you quit smoking, you lower your risk for getting serious diseases and conditions. They can include:  Lung cancer or lung disease.  Heart disease.  Stroke.  Heart attack.  Not being able to have children (infertility).  Weak bones (osteoporosis) and broken bones (fractures).  If you have coughing, wheezing, and shortness of breath, those symptoms may get better when you quit. You may also get sick less often. If you are pregnant, quitting smoking can help to lower your chances of having a baby of low birth weight. What can I do to help me quit  smoking? Talk with your doctor about what can help you quit smoking. Some things you can do (strategies) include:  Quitting smoking totally, instead of slowly cutting back how much you smoke over a period of time.  Going to in-person counseling. You are more likely to quit if you go to many counseling sessions.  Using resources and support systems, such as: ? Agricultural engineer with a Veterinary surgeon. ? Phone quitlines. ? Automotive engineer. ? Support groups or group counseling. ? Text messaging programs. ? Mobile phone apps or applications.  Taking medicines. Some of these medicines may have nicotine in them. If you are pregnant or breastfeeding, do not take any medicines to quit smoking unless your doctor says it is okay. Talk with your doctor about counseling or other things that can help you.  Talk with your doctor about using more than one strategy at the same time, such as taking medicines while you are also going to in-person counseling. This can help make quitting easier. What things can I do to make it easier to quit? Quitting smoking might feel very hard at first, but there is a lot that you can do to make it easier. Take these steps:  Talk to your family and friends. Ask them to support and encourage you.  Call phone quitlines, reach out to support groups, or work with a Veterinary surgeon.  Ask people who smoke to not smoke around you.  Avoid places that make you want (trigger) to smoke, such  as: ? Bars. ? Parties. ? Smoke-break areas at work.  Spend time with people who do not smoke.  Lower the stress in your life. Stress can make you want to smoke. Try these things to help your stress: ? Getting regular exercise. ? Deep-breathing exercises. ? Yoga. ? Meditating. ? Doing a body scan. To do this, close your eyes, focus on one area of your body at a time from head to toe, and notice which parts of your body are tense. Try to relax the muscles in those areas.  Download or buy  apps on your mobile phone or tablet that can help you stick to your quit plan. There are many free apps, such as QuitGuide from the [employer] developer for Disease Control and Prevention). You can find more support from smokefree.gov and other websites.  This information is not intended to replace advice given to you by your health care provider. Make sure you discuss any questions you have with your health care provider. Document Released: 05/12/2009 Document Revised: 03/13/2016 Document Reviewed: 11/30/2014 Elsevier Interactive Patient Education  2018 [employer].

## 2018-05-23 ENCOUNTER — Ambulatory Visit (HOSPITAL_COMMUNITY): Payer: Self-pay

## 2018-05-26 ENCOUNTER — Ambulatory Visit (HOSPITAL_COMMUNITY): Payer: Self-pay

## 2018-05-28 ENCOUNTER — Ambulatory Visit (HOSPITAL_COMMUNITY): Payer: Self-pay

## 2018-05-30 ENCOUNTER — Ambulatory Visit (HOSPITAL_COMMUNITY): Payer: Self-pay

## 2018-06-02 ENCOUNTER — Ambulatory Visit (HOSPITAL_COMMUNITY): Payer: Self-pay

## 2018-06-04 ENCOUNTER — Ambulatory Visit (HOSPITAL_COMMUNITY): Payer: Self-pay

## 2018-06-06 ENCOUNTER — Ambulatory Visit (HOSPITAL_COMMUNITY): Payer: Self-pay

## 2018-06-09 ENCOUNTER — Ambulatory Visit (HOSPITAL_COMMUNITY): Payer: Self-pay

## 2018-06-11 ENCOUNTER — Ambulatory Visit (HOSPITAL_COMMUNITY): Payer: Self-pay

## 2018-06-13 ENCOUNTER — Ambulatory Visit (HOSPITAL_COMMUNITY): Payer: Self-pay

## 2018-06-16 ENCOUNTER — Ambulatory Visit (HOSPITAL_COMMUNITY): Payer: Self-pay

## 2018-06-18 ENCOUNTER — Ambulatory Visit (HOSPITAL_COMMUNITY): Payer: Self-pay

## 2018-06-20 ENCOUNTER — Ambulatory Visit (HOSPITAL_COMMUNITY): Payer: Self-pay

## 2018-06-23 ENCOUNTER — Ambulatory Visit (HOSPITAL_COMMUNITY): Payer: Self-pay

## 2018-06-25 ENCOUNTER — Ambulatory Visit (HOSPITAL_COMMUNITY): Payer: Self-pay

## 2018-08-11 IMAGING — DX DG CHEST 2V
2 series · 2 of 2 positions shown · non-contrast
Comparison: January 07, 2017

CLINICAL DATA: Chest pain today.

EXAM:
CHEST - 2 VIEW

[w chest decub]
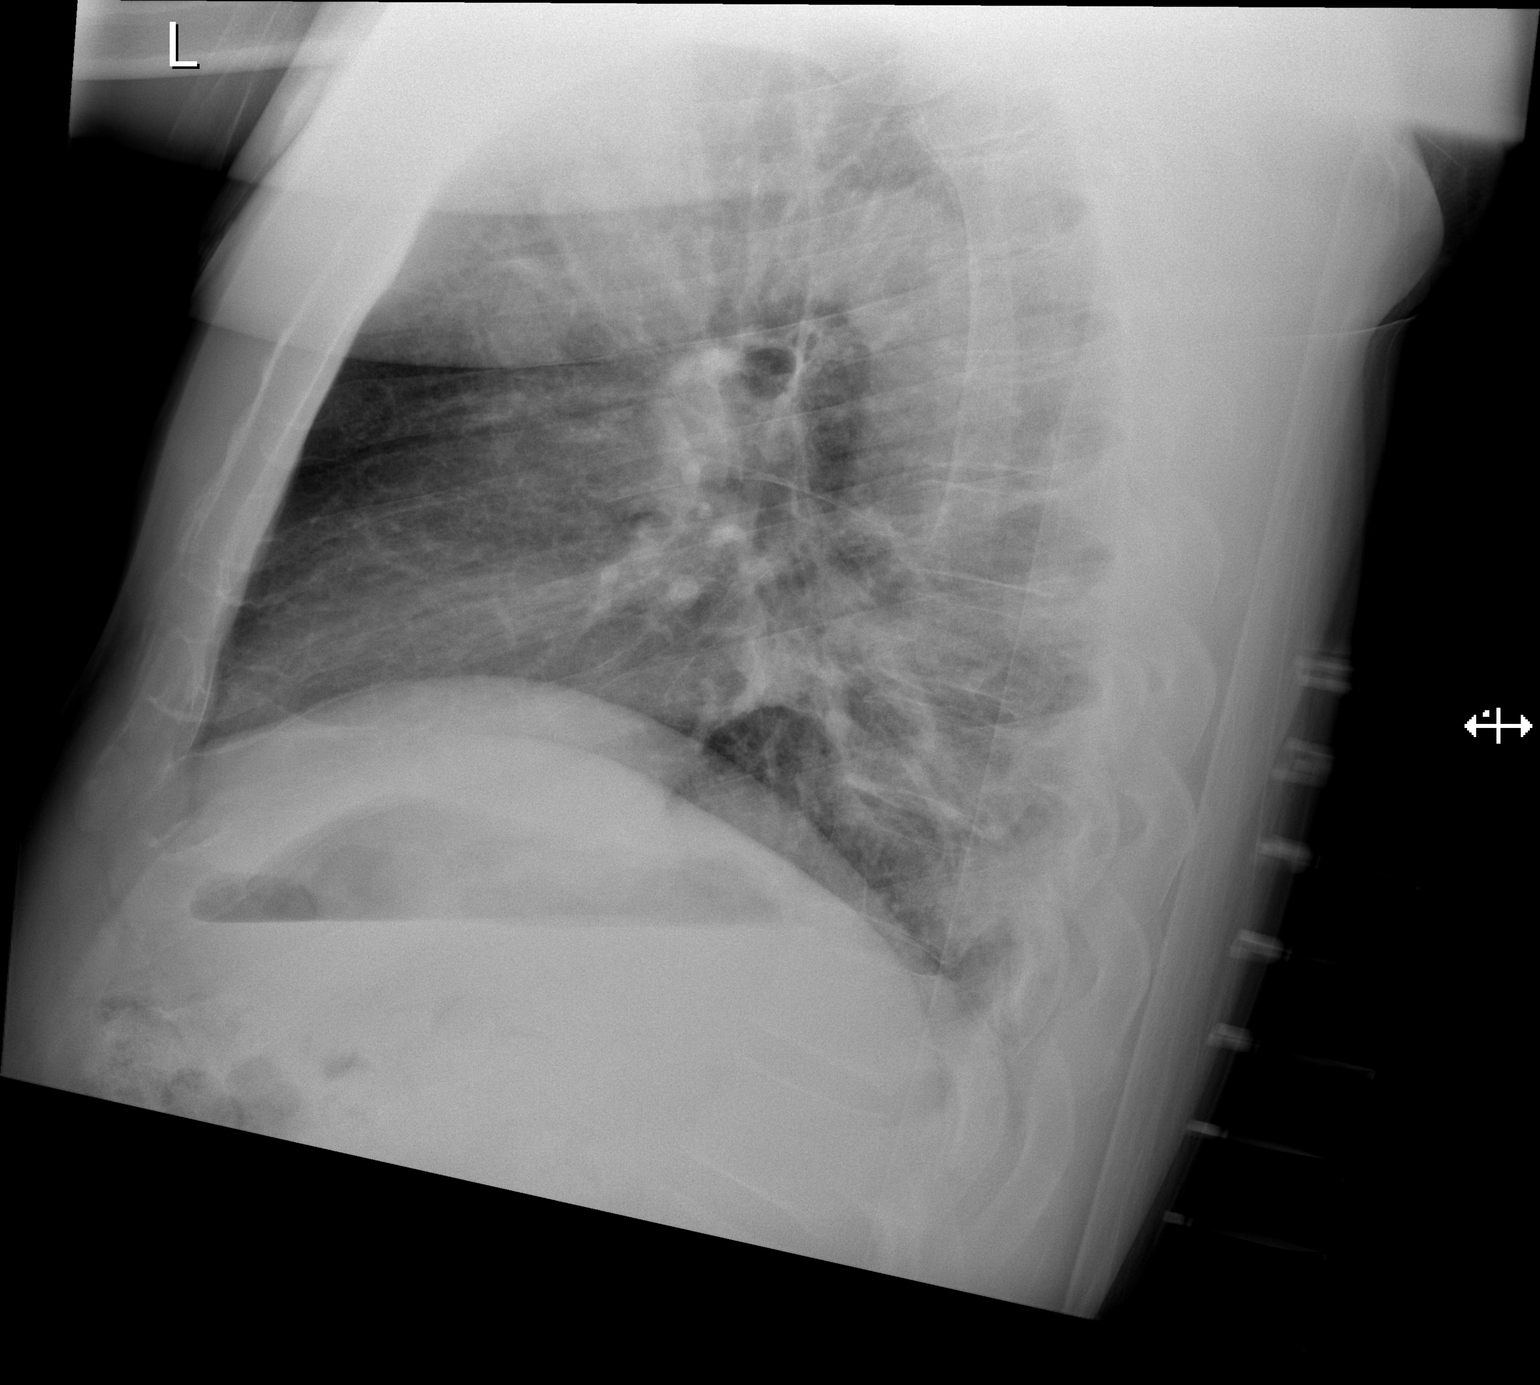

[x chest ap]
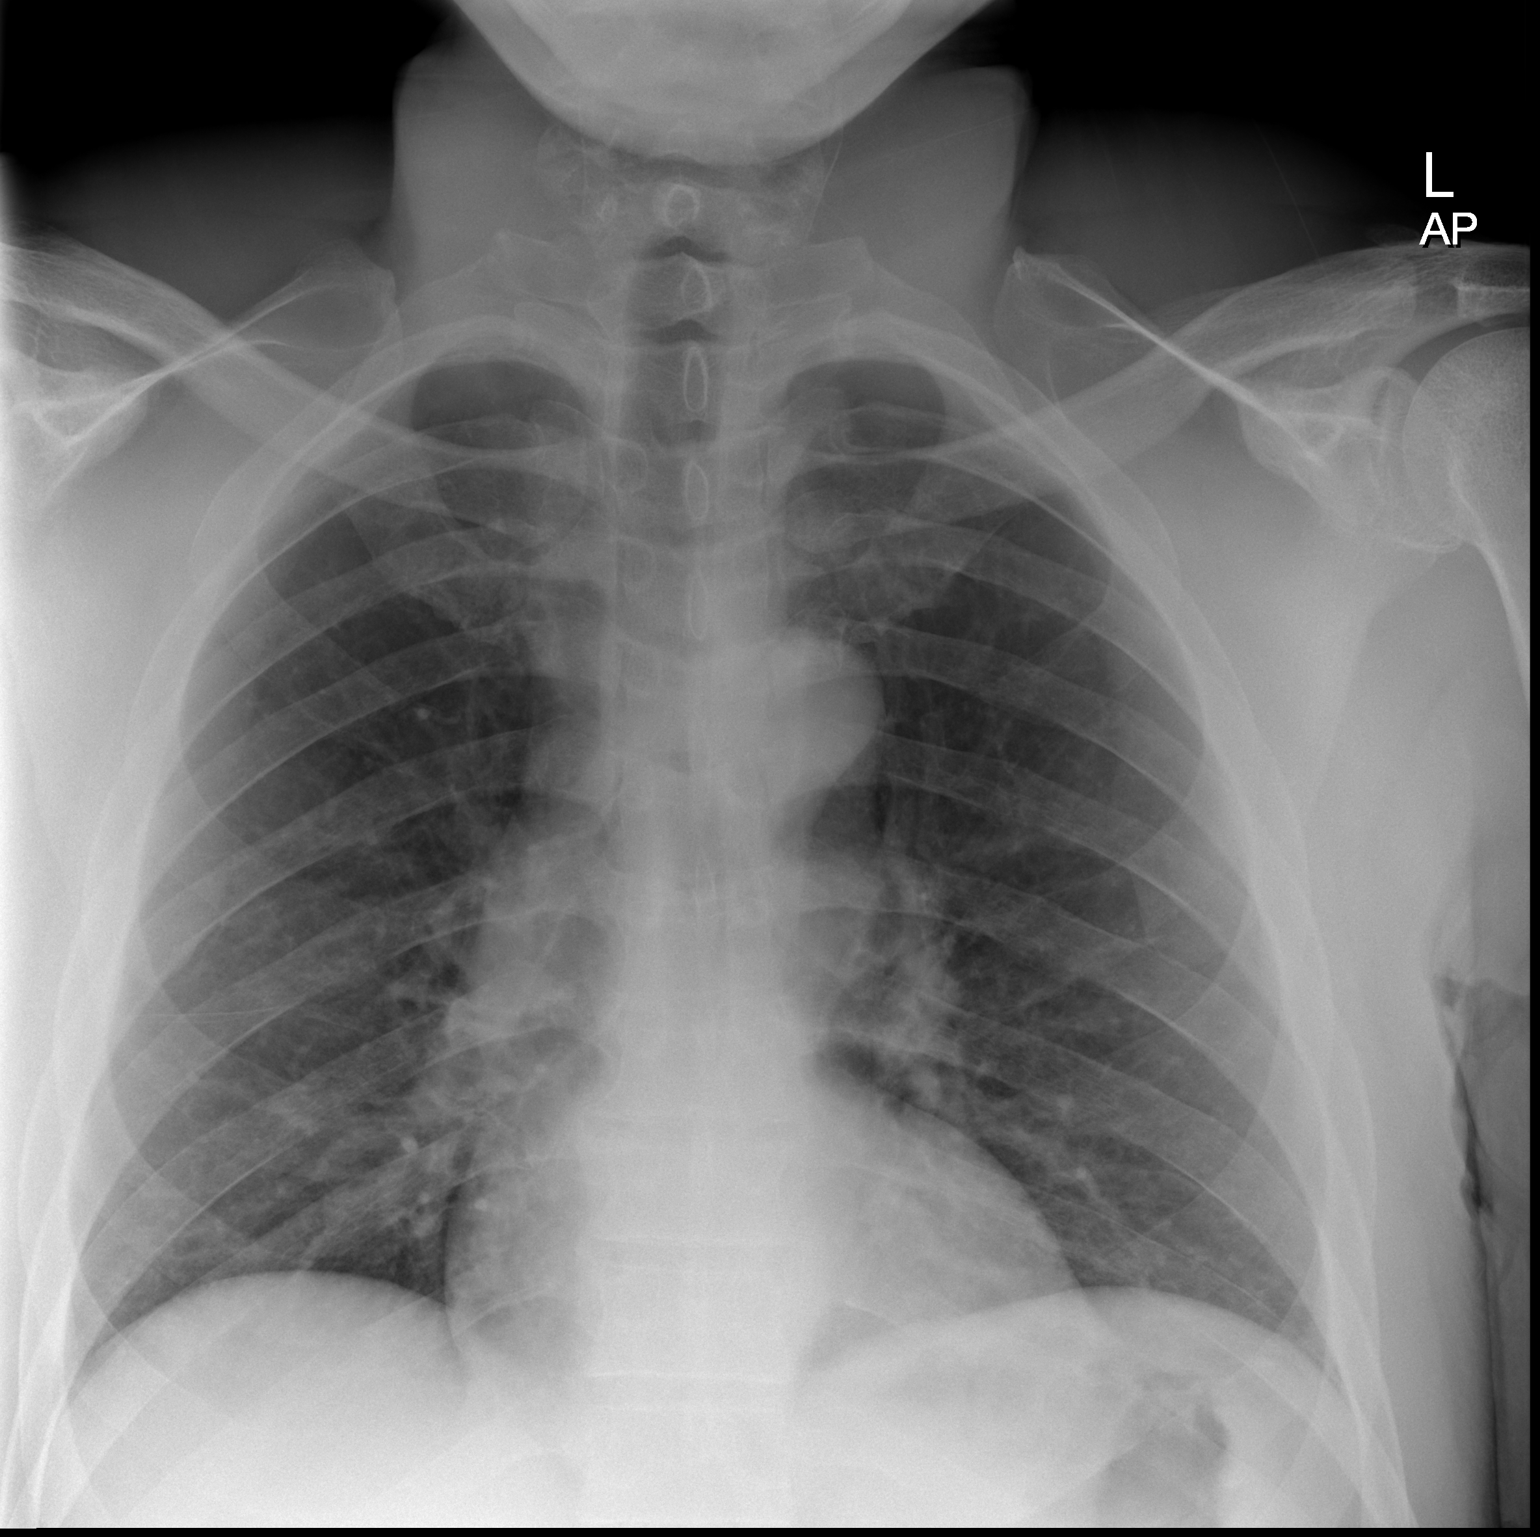

[2 of 2 positions shown; findings below may reference images not displayed]

FINDINGS: The heart size and mediastinal contours are within normal limits.
Both lungs are clear. The visualized skeletal structures are
unremarkable.
IMPRESSION: No active cardiopulmonary disease.

## 2018-10-27 ENCOUNTER — Other Ambulatory Visit: Payer: Self-pay | Admitting: Interventional Cardiology

## 2018-10-28 NOTE — Telephone Encounter (Signed)
Yes, please decline.  Needs to get from PCP.

## 2018-11-17 ENCOUNTER — Telehealth: Payer: Self-pay | Admitting: *Deleted

## 2018-11-17 NOTE — Telephone Encounter (Signed)
lvm with instructions for upcoming visit.  VM was not available so the instructions might have not gone through.  Will call at a later date this week.

## 2018-11-19 NOTE — Telephone Encounter (Signed)
Virtual Visit Pre-Appointment Phone Call  "(Name), I am calling you today to discuss your upcoming appointment. We are currently trying to limit exposure to the virus that causes COVID-19 by seeing patients at home rather than in the office."  1. "What is the BEST phone number to call the day of the visit?" - include this in appointment notes  2. "Do you have or have access to (through a family member/friend) a smartphone with video capability that we can use for your visit?" a. If yes - list this number in appt notes as "cell" (if different from BEST phone #) and list the appointment type as a VIDEO visit in appointment notes b. If no - list the appointment type as a PHONE visit in appointment notes  3. Confirm consent - "In the setting of the current Covid19 crisis, you are scheduled for a (phone or video) visit with your provider on (Monday, April 27) at (9:30am ).  Just as we do with many in-office visits, in order for you to participate in this visit, we must obtain consent.  If you'd like, I can send this to your mychart (if signed up) or email for you to review.  Otherwise, I can obtain your verbal consent now.  All virtual visits are billed to your insurance company just like a normal visit would be.  By agreeing to a virtual visit, we'd like you to understand that the technology does not allow for your provider to perform an examination, and thus may limit your provider's ability to fully assess your condition. If your provider identifies any concerns that need to be evaluated in person, we will make arrangements to do so.  Finally, though the technology is pretty good, we cannot assure that it will always work on either your or our end, and in the setting of a video visit, we may have to convert it to a phone-only visit.  In either situation, we cannot ensure that we have a secure connection.  Are you willing to proceed?" STAFF: Did the patient verbally acknowledge consent to telehealth  visit? Document YES/NO here: YES  4. Advise patient to be prepared - "Two hours prior to your appointment, go ahead and check your blood pressure, pulse, oxygen saturation, and your weight (if you have the equipment to check those) and write them all down. When your visit starts, your provider will ask you for this information. If you have an Apple Watch or Kardia device, please plan to have heart rate information ready on the day of your appointment. Please have a pen and paper handy nearby the day of the visit as well."  5. Give patient instructions for MyChart download to smartphone OR Doximity/Doxy.me as below if video visit (depending on what platform provider is using)  6. Inform patient they will receive a phone call 15 minutes prior to their appointment time (may be from unknown caller ID) so they should be prepared to answer    TELEPHONE CALL NOTE  Sayer Derbyshire Cessna has been deemed a candidate for a follow-up tele-health visit to limit community exposure during the Covid-19 pandemic. I spoke with the patient via phone to ensure availability of phone/video source, confirm preferred email & phone number, and discuss instructions and expectations.  I reminded Blanche Luczak Gonet to be prepared with any vital sign and/or heart rhythm information that could potentially be obtained via home monitoring, at the time of his visit. I reminded Deyvon Stallard Heidenreich to expect a phone  call prior to his visit.  Laakea Pereira Leanord AsalRobin Tyri Elmore 11/19/2018 8:51 AM   INSTRUCTIONS FOR DOWNLOADING THE MYCHART APP TO SMARTPHONE  - The patient must first make sure to have activated MyChart and know their login information - If Apple, go to App Store and type in MyChart in the search bar and download the app. If Android, ask patient to go to [employer] Play Store and type in [place] in the search bar and download the app. The app is free but as with any other app downloads, their phone may require them to verify saved payment  information or Apple/Android password.  - The patient will need to then log into the app with their MyChart username and password, and select [facility] as their healthcare provider to link the account. When it is time for your visit, go to the MyChart app, find appointments, and click Begin Video Visit. Be sure to Select Allow for your device to access the Microphone and Camera for your visit. You will then be connected, and your provider will be with you shortly.  **If they have any issues connecting, or need assistance please contact MyChart service desk [phone])**  **If using a computer, in order to ensure the best quality for their visit they will need to use either of the following Internet Browsers: [employer] Edge, or Google Chrome**  IF USING DOXIMITY or DOXY.ME - The patient will receive a link just prior to their visit by text.     FULL LENGTH CONSENT FOR TELE-HEALTH VISIT   I hereby voluntarily request, consent and authorize [facility] and its employed or contracted physicians, physician assistants, nurse practitioners or other licensed health care professionals (the Practitioner), to provide me with telemedicine health care services (the "Services") as deemed necessary by the treating Practitioner. I acknowledge and consent to receive the Services by the Practitioner via telemedicine. I understand that the telemedicine visit will involve communicating with the Practitioner through live audiovisual communication technology and the disclosure of certain medical information by electronic transmission. I acknowledge that I have been given the opportunity to request an in-person assessment or other available alternative prior to the telemedicine visit and am voluntarily participating in the telemedicine visit.  I understand that I have the right to withhold or withdraw my consent to the use of telemedicine in the course of my care at any time, without affecting my right  to future care or treatment, and that the Practitioner or I may terminate the telemedicine visit at any time. I understand that I have the right to inspect all information obtained and/or recorded in the course of the telemedicine visit and may receive copies of available information for a reasonable fee.  I understand that some of the potential risks of receiving the Services via telemedicine include:  Marland Kitchen. Delay or interruption in medical evaluation due to technological equipment failure or disruption; . Information transmitted may not be sufficient (e.g. poor resolution of images) to allow for appropriate medical decision making by the Practitioner; and/or  . In rare instances, security protocols could fail, causing a breach of personal health information.  Furthermore, I acknowledge that it is my responsibility to provide information about my medical history, conditions and care that is complete and accurate to the best of my ability. I acknowledge that Practitioner's advice, recommendations, and/or decision may be based on factors not within their control, such as incomplete or inaccurate data provided by me or distortions of diagnostic images or specimens that may result from  electronic transmissions. I understand that the practice of medicine is not an exact science and that Practitioner makes no warranties or guarantees regarding treatment outcomes. I acknowledge that I will receive a copy of this consent concurrently upon execution via email to the email address I last provided but may also request a printed copy by calling the office of [facility].    I understand that my insurance will be billed for this visit.   I have read or had this consent read to me. . I understand the contents of this consent, which adequately explains the benefits and risks of the Services being provided via telemedicine.  . I have been provided ample opportunity to ask questions regarding this consent and the Services  and have had my questions answered to my satisfaction. . I give my informed consent for the services to be provided through the use of telemedicine in my medical care  By participating in this telemedicine visit I agree to the above.

## 2018-11-23 NOTE — Progress Notes (Signed)
Telehealth Visit     Virtual Visit via Video Note   This visit type was conducted due to national recommendations for restrictions regarding the COVID-19 Pandemic (e.g. social distancing) in an effort to limit this patient's exposure and mitigate transmission in our community.  Due to his co-morbid illnesses, this patient is at least at moderate risk for complications without adequate follow up.  This format is felt to be most appropriate for this patient at this time.  All issues noted in this document were discussed and addressed.  A limited physical exam was performed with this format.  Please refer to the patient's chart for his consent [facility].   Evaluation Performed:  Follow-up visit  This visit type was conducted due to national recommendations for restrictions regarding the COVID-19 Pandemic (e.g. social distancing).  This format is felt to be most appropriate for this patient at this time.  All issues noted in this document were discussed and addressed.  No physical exam was performed (except for noted visual exam findings with Video Visits).  Please refer to the patient's chart (MyChart message for video visits and phone note for telephone visits) for the patient's consent to telehealth [facility].  Date:  11/24/2018   ID:  Jose FisherAndre J Schultz, DOB 02/21/1966, MRN [id number]  Patient Location:  Home  Provider location:   Home  PCP:  [facility]  Cardiologist:  Tyrone SageGerhardt & Lesleigh NoeHenry W Smith III, MD  Electrophysiologist:  None   Chief Complaint:  Follow up visit.   History of Present Illness:    Jose Schultz is a 53 y.o. male who presents via Web designeraudio/video conferencing for a telehealth visit today.  Seen for Dr. Katrinka BlazingSmith.   He has a history of HTN, HLD, CAD with prior NSTEMI with prior PCI/DES x 1 to the 1st OM in 10/2017. He has had grade 2 diastolic dysfunction on prior echo. Ongoing tobacco abuse. Last seen in October 2019 by  Dr. Katrinka BlazingSmith - felt to be doing ok - did have some shortness of breath - possibly related to COPD but was also on Brilinita and this was changed to Plavix for another 6 months. He had had a back injury which limited his ability to exercise and continued to smoke heavily.   Unknown if the patient has symptoms concerning for COVID-19 infection (fever, chills, cough, or new shortness of breath).   Attempted to see via Doximity video. He wished to reschedule this visit and did not wish to have telehealth visit today. He forgot that we had called him last week.    Past Medical History:  Diagnosis Date  . Chronic back pain    spondylolisthesis and radiculopathy  . Depression    hx of-per pt no meds now  . Hypertension   . Muscle spasm of back    takes Flexeril as needed  . Weakness    numbness and tingling both feet   Past Surgical History:  Procedure Laterality Date  . BACK SURGERY    . CORONARY STENT INTERVENTION N/A 11/04/2017   Procedure: CORONARY STENT INTERVENTION;  Surgeon: Corky CraftsVaranasi, Jayadeep S, MD;  [facility];  Service: Cardiovascular;  Laterality: N/A;  . LEFT HEART CATH AND CORONARY ANGIOGRAPHY N/A 11/04/2017   Procedure: LEFT HEART CATH AND CORONARY ANGIOGRAPHY;  Surgeon: Corky CraftsVaranasi, Jayadeep S, MD;  [facility];  Service: Cardiovascular;  Laterality: N/A;  . right 5th finger surgery     in  high school     No outpatient medications have been marked as taking for the 11/24/18 encounter (Telemedicine) with Rosalio Macadamia, NP.     Allergies:   Penicillins; Doxycycline; Morphine and related; Bee venom; and Other   Social History   Tobacco Use  . Smoking status: Former Smoker    Packs/day: 0.50    Years: 32.00    Pack years: 16.00    Types: Cigarettes    Last attempt to quit: 11/04/2017    Years since quitting: 1.0  . Smokeless tobacco: Never Used  Substance Use Topics  . Alcohol use: No  . Drug use: Not Currently    Types: "Crack" cocaine     Comment: denies anything in past yr     Family Hx: The patient's family history includes Hypertension in his mother; Kidney failure in his mother.  ROS:   Please see the history of present illness.   All other systems reviewed are unknown.   Objective:    Vital Signs:  There were no vitals taken for this visit.   Wt Readings from Last 3 Encounters:  05/22/18 208 lb 6.4 oz (94.5 kg)  11/19/17 192 lb (87.1 kg)  11/05/17 187 lb 6.3 oz (85 kg)    No exam.    Labs/Other Tests and Data Reviewed:    Lab Results  Component Value Date   WBC 5.8 11/05/2017   HGB 13.8 11/05/2017   HCT 40.0 11/05/2017   PLT 159 11/05/2017   GLUCOSE 75 11/05/2017   CHOL 91 (L) 12/19/2017   TRIG 65 12/19/2017   HDL 32 (L) 12/19/2017   LDLCALC 46 12/19/2017   ALT 24 12/19/2017   AST 26 12/19/2017   NA 139 11/05/2017   K 3.4 (L) 11/05/2017   CL 109 11/05/2017   CREATININE 1.39 (H) 11/05/2017   BUN 9 11/05/2017   CO2 24 11/05/2017   TSH 0.859 11/04/2017   INR 1.14 06/15/2010   HGBA1C 5.1 11/04/2017     BNP (last 3 results) No results for input(s): BNP in the last 8760 hours.  ProBNP (last 3 results) No results for input(s): PROBNP in the last 8760 hours.    Prior CV studies:    The following studies were reviewed today:   CORONARY STENT INTERVENTION  LEFT HEART CATH AND CORONARY ANGIOGRAPHY 10/2017  Conclusion     Ost Ramus lesion is 25% stenosed.  Mild ectasia in the proximal LAD.  LV end diastolic pressure is mildly elevated.  There is no aortic valve stenosis.  Ost 1st Mrg to 1st Mrg lesion is 100% stenosed.  A drug-eluting stent was successfully placed using a STENT RESOLUTE ONYX 2.0X22.  Post intervention, there is a 0% residual stenosis.   Continue dual antiplatlelet therapy for 1 year, along with aggressive secondary prevention including smoking cessation.      Echo Study Conclusions 10/2017  - Left ventricle: The cavity size was normal. Wall thickness  was   normal. Systolic function was normal. The estimated ejection   fraction was in the range of 60% to 65%. Wall motion was normal;   there were no regional wall motion abnormalities. Features are   consistent with a pseudonormal left ventricular filling pattern,   with concomitant abnormal relaxation and increased filling   pressure (grade 2 diastolic dysfunction). - Mitral valve: There was mild regurgitation.   Assessment & Plan:  1. CAD  2. Grade 2 diastolic dysfunction noted on prior echo.   3. HLD  4. HTN  5. Heavy tobacco abuse  6. Need for COVID-19 Education:  Time:   Today, I have spent 0 minutes with the patient with telehealth technology discussing the above issues.     Medication Adjustments/Labs and Tests Ordered: Current medicines are reviewed at length with the patient today.  Concerns regarding medicines are outlined above.   Tests Ordered: No orders of the defined types were placed in this encounter.   Medication Changes: No orders of the defined types were placed in this encounter.   Disposition:  FU with Dr. Katrinka Blazing per patient request.   Patient is agreeable to this plan and will call if any problems develop in the interim.   Avelina Laine, NP  11/24/2018 9:28 AM    [facility]

## 2018-11-24 ENCOUNTER — Telehealth: Payer: BLUE CROSS/BLUE SHIELD | Admitting: Nurse Practitioner

## 2018-11-24 ENCOUNTER — Encounter: Payer: Self-pay | Admitting: Nurse Practitioner

## 2018-11-24 ENCOUNTER — Telehealth: Payer: Self-pay | Admitting: *Deleted

## 2018-11-24 ENCOUNTER — Other Ambulatory Visit: Payer: Self-pay

## 2018-11-24 NOTE — Telephone Encounter (Signed)
S/w pt forgot appt was today is out and wants to r/s. Lawson Fiscal stated with Dr. Katrinka Blazing.  Send Constance Holster, LPN a message to call pt and R/S.

## 2018-11-24 NOTE — Telephone Encounter (Signed)
lvm about pt's visit today that was discussed last week.  LVM to either call back in the next couple of minutes or this visit will have to be R/S.

## 2018-11-27 ENCOUNTER — Telehealth: Payer: Self-pay | Admitting: Interventional Cardiology

## 2018-11-27 NOTE — Telephone Encounter (Signed)
Received message from [place], [place] about pt needing appt with Dr. Katrinka Blazing.  Needs to be scheduled for virtual visit with Doximity with Dr. Katrinka Blazing.  Was going to offer 5/6 at 8A or 12:30P, whichever was still available.  Please send to me or Alonna Minium to schedule.

## 2018-11-28 NOTE — Telephone Encounter (Signed)
Left message to call back  

## 2018-12-01 NOTE — Telephone Encounter (Signed)
Left message to call back.  Was going to offer virtual visit 5/6 at 3pm or 5/12 at 8:30A, 9:30A or 2:30P, whichever was still available.

## 2018-12-03 NOTE — Telephone Encounter (Signed)
Pt scheduled for VIDEO 5/12 at 230pm with Neldon McSmith     Virtual Visit Pre-Appointment Phone Call  "(Name), I am calling you today to discuss your upcoming appointment. We are currently trying to limit exposure to the virus that causes COVID-19 by seeing patients at home rather than in the office."  1. "What is the BEST phone number to call the day of the visit?" - include this in appointment notes  2. Do you have or have access to (through a family member/friend) a smartphone with video capability that we can use for your visit?" a. If yes - list this number in appt notes as cell (if different from BEST phone #) and list the appointment type as a VIDEO visit in appointment notes b. If no - list the appointment type as a PHONE visit in appointment notes  3. Confirm consent - "In the setting of the current Covid19 crisis, you are scheduled for a VIDEO visit with your provider on 5/12 at 230pm.  Just as we do with many in-office visits, in order for you to participate in this visit, we must obtain consent.  If you'd like, I can send this to your mychart (if signed up) or email for you to review.  Otherwise, I can obtain your verbal consent now.  All virtual visits are billed to your insurance company just like a normal visit would be.  By agreeing to a virtual visit, we'd like you to understand that the technology does not allow for your provider to perform an examination, and thus may limit your provider's ability to fully assess your condition. If your provider identifies any concerns that need to be evaluated in person, we will make arrangements to do so.  Finally, though the technology is pretty good, we cannot assure that it will always work on either your or our end, and in the setting of a video visit, we may have to convert it to a phone-only visit.  In either situation, we cannot ensure that we have a secure connection.  Are you willing to proceed?" STAFF: Did the patient verbally acknowledge  consent to telehealth visit? Document YES/NO here: YES  4. Advise patient to be prepared - "Two hours prior to your appointment, go ahead and check your blood pressure, pulse, oxygen saturation, and your weight (if you have the equipment to check those) and write them all down. When your visit starts, your provider will ask you for this information. If you have an Apple Watch or Kardia device, please plan to have heart rate information ready on the day of your appointment. Please have a pen and paper handy nearby the day of the visit as well."  5. Give patient instructions for MyChart download to smartphone OR [email or web address] as below if video visit (depending on what platform provider is using)  6. Inform patient they will receive a phone call 15 minutes prior to their appointment time (may be from unknown caller ID) so they should be prepared to answer    TELEPHONE CALL NOTE  Toma Aranndre J Blanchard has been deemed a candidate for a follow-up tele-health visit to limit community exposure during the Covid-19 pandemic. I spoke with the patient via phone to ensure availability of phone/video source, confirm preferred email & phone number, and discuss instructions and expectations.  I reminded Toma Aranndre J Stgermain to be prepared with any vital sign and/or heart rhythm information that could potentially be obtained via home monitoring, at the time of his visit. I reminded Debby Budndre  J Legore to expect a phone call prior to his visit.  Leanord Hawking, RN 12/03/2018 11:48 AM

## 2018-12-08 NOTE — Progress Notes (Signed)
Virtual Visit via Video Note   This visit type was conducted due to national recommendations for restrictions regarding the COVID-19 Pandemic (e.g. social distancing) in an effort to limit this patient's exposure and mitigate transmission in our community.  Due to his co-morbid illnesses, this patient is at least at moderate risk for complications without adequate follow up.  This format is felt to be most appropriate for this patient at this time.  All issues noted in this document were discussed and addressed.  A limited physical exam was performed with this format.  Please refer to the patient's chart for his consent to telehealth for [facility].   Date:  12/08/2018   ID:  Jose Schultz, DOB June 18, 1966, MRN [id number]  Patient Location: Home Provider Location: Office  PCP:  [facility]  Cardiologist:  Lesleigh Noe, MD  Electrophysiologist:  None   Evaluation Performed:  Follow-Up Visit  Chief Complaint:  CAD  History of Present Illness:    Jose Schultz is a 53 y.o. male with hx of HTN, HLD,  NSTEMI, and  successful PCI/DES x1 to the small 1st OM. Otherwise coronary arteries revealed diffuse LI.  He has no cardiovascular complaints.  He has noted elevated blood pressure at times near 150 mmHg systolic.  He has discontinued smoking.  He feels that he gets greater than 150 minutes of moderate activity per week.  He is compliant with his current medical regimen.  The patient does not have symptoms concerning for COVID-19 infection (fever, chills, cough, or new shortness of breath).    Past Medical History:  Diagnosis Date  . Chronic back pain    spondylolisthesis and radiculopathy  . Depression    hx of-per pt no meds now  . Hypertension   . Muscle spasm of back    takes Flexeril as needed  . Weakness    numbness and tingling both feet   Past Surgical History:  Procedure Laterality Date  . BACK SURGERY    . CORONARY STENT  INTERVENTION N/A 11/04/2017   Procedure: CORONARY STENT INTERVENTION;  Surgeon: Corky Crafts, MD;  Location: [facility] INVASIVE CV LAB;  Service: Cardiovascular;  Laterality: N/A;  . LEFT HEART CATH AND CORONARY ANGIOGRAPHY N/A 11/04/2017   Procedure: LEFT HEART CATH AND CORONARY ANGIOGRAPHY;  Surgeon: Corky Crafts, MD;  Location: [facility] INVASIVE CV LAB;  Service: Cardiovascular;  Laterality: N/A;  . right 5th finger surgery     in high school     No outpatient medications have been marked as taking for the 12/09/18 encounter (Appointment) with Lyn Records, MD.     Allergies:   Penicillins; Doxycycline; Morphine and related; Bee venom; and Other   Social History   Tobacco Use  . Smoking status: Former Smoker    Packs/day: 0.50    Years: 32.00    Pack years: 16.00    Types: Cigarettes    Last attempt to quit: 11/04/2017    Years since quitting: 1.0  . Smokeless tobacco: Never Used  Substance Use Topics  . Alcohol use: No  . Drug use: Not Currently    Types: "Crack" cocaine    Comment: denies anything in past yr     Family Hx: The patient's family history includes Hypertension in his mother; Kidney failure in his mother.  ROS:   Please see the history of present illness.    No specific complaints. All other systems reviewed and are negative.   Prior  CV studies:   The following studies were reviewed today: ECHOCARDIOGRAM 2019 Study Conclusions  - Left ventricle: The cavity size was normal. Wall thickness was   normal. Systolic function was normal. The estimated ejection   fraction was in the range of 60% to 65%. Wall motion was normal;   there were no regional wall motion abnormalities. Features are   consistent with a pseudonormal left ventricular filling pattern,   with concomitant abnormal relaxation and increased filling   pressure (grade 2 diastolic dysfunction). - Mitral valve: There was mild regurgitation.  CARDIAC CATH 11/04/2017: Diagnostic  Dominance:  Right    Intervention      Labs/Other Tests and Data Reviewed:    EKG:  No ECG reviewed.  Recent Labs: 12/19/2017: ALT 24   Recent Lipid Panel Lab Results  Component Value Date/Time   CHOL 91 (L) 12/19/2017 08:47 AM   TRIG 65 12/19/2017 08:47 AM   HDL 32 (L) 12/19/2017 08:47 AM   CHOLHDL 2.8 12/19/2017 08:47 AM   CHOLHDL 4.8 11/03/2017 04:33 AM   LDLCALC 46 12/19/2017 08:47 AM    Wt Readings from Last 3 Encounters:  05/22/18 208 lb 6.4 oz (94.5 kg)  11/19/17 192 lb (87.1 kg)  11/05/17 187 lb 6.3 oz (85 kg)     Objective:    Vital Signs:  There were no vitals taken for this visit.   VITAL SIGNS:  reviewed GEN:  no acute distress RESPIRATORY:  normal respiratory effort, symmetric expansion CARDIOVASCULAR:  no peripheral edema  ASSESSMENT & PLAN:    1. Coronary artery disease involving native coronary artery of native heart with unstable angina pectoris (HCC)   2. Hyperlipidemia, unspecified hyperlipidemia type   3. Tobacco abuse   4. Essential hypertension   5. 2019 novel coronavirus disease (COVID-19)    PLAN:  1. Secondary risk prevention discussed in detail. 2. LDL target less than 70.  Most recently as listed above at less than 50. 3. Abstaining from cigarette smoking. 4. Increase lisinopril to 10 mg/day.  Prior to myocardial infarction was on 20 mg daily.  He will need a basic metabolic panel in 2 weeks.  Target blood pressure discussed and recommended 130/80 mmHg.  He will call results to us in 2 to 4 weeks.  COVID-19 Education: The signs and symptoms of COVID-19 were discussed with the patient and how to seek care for testing (follow up with PCP or arrange E-visit).  The importance of social distancing was discussed today.  Time:   Today, I have spent 15 minutes with the patient with telehealth technology discussing the above problems.     Medication Adjustments/Labs and Tests Ordered: Current medicines are reviewed at length with the patient today.   Concerns regarding medicines are outlined above.   Tests Ordered: No orders of the defined types were placed in this encounter.   Medication Changes: No orders of the defined types were placed in this encounter.   Disposition:  Follow up in 6 month(s)  Signed, Lesleigh NoeHenry W Alaia Lordi III, MD  12/08/2018 3:53 PM    [facility]

## 2018-12-09 ENCOUNTER — Encounter: Payer: Self-pay | Admitting: Interventional Cardiology

## 2018-12-09 ENCOUNTER — Other Ambulatory Visit: Payer: Self-pay

## 2018-12-09 ENCOUNTER — Telehealth (INDEPENDENT_AMBULATORY_CARE_PROVIDER_SITE_OTHER): Payer: BLUE CROSS/BLUE SHIELD | Admitting: Interventional Cardiology

## 2018-12-09 VITALS — BP 132/93 | Ht 68.0 in | Wt 213.0 lb

## 2018-12-09 DIAGNOSIS — I10 Essential (primary) hypertension: Secondary | ICD-10-CM

## 2018-12-09 DIAGNOSIS — I25110 Atherosclerotic heart disease of native coronary artery with unstable angina pectoris: Secondary | ICD-10-CM | POA: Diagnosis not present

## 2018-12-09 DIAGNOSIS — E785 Hyperlipidemia, unspecified: Secondary | ICD-10-CM

## 2018-12-09 DIAGNOSIS — Z720 Tobacco use: Secondary | ICD-10-CM

## 2018-12-09 DIAGNOSIS — U071 COVID-19: Secondary | ICD-10-CM

## 2018-12-09 MED ORDER — LISINOPRIL 10 MG PO TABS: 10.0000 mg | ORAL_TABLET | Freq: Every day | ORAL | 3 refills | Status: DC

## 2018-12-09 NOTE — Addendum Note (Signed)
Addended by: Julio Sicks on: 12/09/2018 03:25 PM   Modules accepted: Orders

## 2018-12-09 NOTE — Patient Instructions (Signed)
Medication Instructions:  1) INCREASE Lisinopril to 10mg  once daily  If you need a refill on your cardiac medications before your next appointment, please call your pharmacy.   Lab work: Your physician recommends that you return for lab work in: 2 weeks  If you have labs (blood work) drawn today and your tests are completely normal, you will receive your results only by: Marland Kitchen MyChart Message (if you have MyChart) OR . A paper copy in the mail If you have any lab test that is abnormal or we need to change your treatment, we will call you to review the results.  Testing/Procedures: None  Follow-Up: At [facility], you and your health needs are our priority.  As part of our continuing mission to provide you with exceptional heart care, we have created designated Provider Care Teams.  These Care Teams include your primary Cardiologist (physician) and Advanced Practice Providers (APPs -  Physician Assistants and Nurse Practitioners) who all work together to provide you with the care you need, when you need it. You will need a follow up appointment in 6 months.  Please call our office 2 months in advance to schedule this appointment.  You may see Lesleigh Noe, MD or one of the following Advanced Practice Providers on your designated Care Team:   Norma Fredrickson, NP Nada Boozer, NP . Georgie Chard, NP  Any Other Special Instructions Will Be Listed Below (If Applicable).

## 2018-12-24 ENCOUNTER — Other Ambulatory Visit: Payer: BLUE CROSS/BLUE SHIELD

## 2019-05-13 ENCOUNTER — Other Ambulatory Visit: Payer: Self-pay | Admitting: Interventional Cardiology

## 2019-11-04 ENCOUNTER — Other Ambulatory Visit: Payer: Self-pay

## 2019-11-04 MED ORDER — LISINOPRIL 10 MG PO TABS: 10.0000 mg | ORAL_TABLET | Freq: Every day | ORAL | 0 refills | Status: DC

## 2020-02-02 ENCOUNTER — Other Ambulatory Visit: Payer: Self-pay | Admitting: Interventional Cardiology

## 2020-02-02 MED ORDER — LISINOPRIL 10 MG PO TABS: 10.0000 mg | ORAL_TABLET | Freq: Every day | ORAL | 0 refills | Status: DC

## 2020-09-05 ENCOUNTER — Other Ambulatory Visit: Payer: Self-pay | Admitting: Interventional Cardiology

## 2020-09-28 ENCOUNTER — Telehealth: Payer: Self-pay | Admitting: *Deleted

## 2020-09-28 NOTE — Telephone Encounter (Signed)
   [facility] Pre-operative Risk Assessment    HEARTCARE STAFF: - Please ensure there is not already an duplicate clearance open for this procedure. - Under Visit Info/Reason for Call, type in Other and utilize the format Clearance MM/DD/YY or Clearance TBD. Do not use dashes or single digits. - If request is for dental extraction, please clarify the # of teeth to be extracted.  Request for surgical clearance:  1. What type of surgery is being performed? TRANSFORAMINAL EPIDURAL STEROID INJECTION   2. When is this surgery scheduled? 10/10/20   3. What type of clearance is required (medical clearance vs. Pharmacy clearance to hold med vs. Both)? MEDICAL  4. Are there any medications that need to be held prior to surgery and how long? X 7 DAYS PRIOR TO PROCEDURE   5. Practice name and name of physician performing surgery? [facility]   6. What is the office phone number? [phone]   7.   What is the office fax number? [phone]  8.   Anesthesia type (None, local, MAC, general) ? NONE LISTED   Julaine Hua 09/28/2020, 12:20 PM  _________________________________________________________________   (provider comments below)

## 2020-09-28 NOTE — Telephone Encounter (Signed)
   Primary Cardiologist: Lesleigh Noe, MD  Chart reviewed as part of pre-operative protocol coverage. Because of Jose Schultz's past medical history and time since last visit, he will require a follow-up visit in order to better assess preoperative cardiovascular risk.  This patient has not been seen since 12/09/2018 by Dr. Katrinka Blazing therefore we cannot adequately and safely clear him for any procedure.   Pre-op covering staff: - Please schedule appointment and call patient to inform them. If patient already had an upcoming appointment within acceptable timeframe, please add "pre-op clearance" to the appointment notes so provider is aware. - Please contact requesting surgeon's office via preferred method (i.e, phone, fax) to inform them of need for appointment prior to surgery.  If applicable, this message will also be routed to pharmacy pool and/or primary cardiologist for input on holding anticoagulant/antiplatelet agent as requested below so that this information is available to the clearing provider at time of patient's appointment.   Georgie Chard, NP  09/28/2020, 1:10 PM

## 2020-09-28 NOTE — Telephone Encounter (Signed)
Pt agreeable to pre op appt needed. No appts available at [facility]. Location. Pt has been scheduled to see Corine Shelter, [facility] 09/29/20 @ 10:45. I will forward notes to [facility] for upcoming appt.

## 2020-09-29 ENCOUNTER — Other Ambulatory Visit: Payer: Self-pay

## 2020-09-29 ENCOUNTER — Ambulatory Visit (INDEPENDENT_AMBULATORY_CARE_PROVIDER_SITE_OTHER): Payer: BLUE CROSS/BLUE SHIELD | Admitting: Cardiology

## 2020-09-29 ENCOUNTER — Encounter: Payer: Self-pay | Admitting: Cardiology

## 2020-09-29 VITALS — BP 118/78 | HR 65 | Ht 68.0 in | Wt 205.0 lb

## 2020-09-29 DIAGNOSIS — I2510 Atherosclerotic heart disease of native coronary artery without angina pectoris: Secondary | ICD-10-CM | POA: Diagnosis not present

## 2020-09-29 DIAGNOSIS — I214 Non-ST elevation (NSTEMI) myocardial infarction: Secondary | ICD-10-CM

## 2020-09-29 DIAGNOSIS — Z01818 Encounter for other preprocedural examination: Secondary | ICD-10-CM | POA: Diagnosis not present

## 2020-09-29 DIAGNOSIS — E785 Hyperlipidemia, unspecified: Secondary | ICD-10-CM | POA: Diagnosis not present

## 2020-09-29 DIAGNOSIS — F17200 Nicotine dependence, unspecified, uncomplicated: Secondary | ICD-10-CM | POA: Insufficient documentation

## 2020-09-29 DIAGNOSIS — Z9861 Coronary angioplasty status: Secondary | ICD-10-CM

## 2020-09-29 NOTE — Assessment & Plan Note (Signed)
Followed by his PCP 

## 2020-09-29 NOTE — Telephone Encounter (Signed)
   Primary Cardiologist: Lesleigh Noe, MD  Chart reviewed and patient seen in the office today as part of pre-operative protocol coverage. Given past medical history and time since last visit, based on ACC/AHA guidelines, Jose Schultz would be at acceptable risk for the planned procedure without further cardiovascular testing.   OK to hold plavix 7 days pre op, aspirin 3-5 days pre op.  The patient was advised that if he develops new symptoms prior to surgery to contact our office to arrange for a follow-up visit, and he verbalized understanding.  I will route this recommendation to the requesting party via Epic fax function and remove from pre-op pool.  Please call with questions.  Corine Shelter, PA-C 09/29/2020, 11:46 AM

## 2020-09-29 NOTE — Patient Instructions (Signed)
Medication Instructions:  Continue current medications  *If you need a refill on your cardiac medications before your next appointment, please call your pharmacy*   Lab Work: None ordered   Testing/Procedures: None ordered   Follow-Up: At [facility], you and your health needs are our priority.  As part of our continuing mission to provide you with exceptional heart care, we have created designated Provider Care Teams.  These Care Teams include your primary Cardiologist (physician) and Advanced Practice Providers (APPs -  Physician Assistants and Nurse Practitioners) who all work together to provide you with the care you need, when you need it.  We recommend signing up for the patient portal called "MyChart".  Sign up information is provided on this After Visit Summary.  MyChart is used to connect with patients for Virtual Visits (Telemedicine).  Patients are able to view lab/test results, encounter notes, upcoming appointments, etc.  Non-urgent messages can be sent to your provider as well.   To learn more about what you can do with MyChart, go to [email or web address].    Your next appointment:   1 year(s)  The format for your next appointment:   In Person  Provider:   You may see Lesleigh Noe, MD or one of the following Advanced Practice Providers on your designated Care Team:    Georgie Chard, NP

## 2020-09-29 NOTE — Assessment & Plan Note (Signed)
Patient is at acceptable risk from a cardiac standpoint for the proposed procedure.  He can hold his Plavix 7 days prior to his spinal injection and his aspirin 3 to 5 days.  I will forward official clearance via epic fax

## 2020-09-29 NOTE — Assessment & Plan Note (Signed)
OM1 intervention with DES April 2019 in the setting of an NSTEMI.  He had no other significant CAD.  He is done well since from a cardiac standpoint.

## 2020-09-29 NOTE — Assessment & Plan Note (Signed)
Patient is 1/2 pack a day smoker.  I discussed the importance of smoking cessation with him.

## 2020-09-29 NOTE — Progress Notes (Signed)
Cardiology Office Note:    Date:  09/29/2020   ID:  Jose Schultz, DOB 10/27/1965, MRN [id number]  PCP:  [facility]  Cardiologist:  Lesleigh Noe, MD  Electrophysiologist:  None   Referring MD: [facility]*   No chief complaint on file. Pre op clearance  History of Present Illness:    Jose Schultz is a 55 y.o. male with a hx of CAD, status post NSTEMI April 2019 treated with OM1 DES.  He had no other significant coronary disease at catheterization.  He is in the office today for preop clearance prior to her spinal injection which is scheduled for October 10, 2020.  From a cardiac standpoint he is done well since his intervention.  He continues to smoke a half a pack a day.  His primary care provider is following his lipids.  The patient tells me that he is to have a colonoscopy tomorrow at [facility] and he has been holding his aspirin and Plavix already for 5 days.  He denies any chest pain.  Past Medical History:  Diagnosis Date  . Chronic back pain    spondylolisthesis and radiculopathy  . Depression    hx of-per pt no meds now  . Hypertension   . Muscle spasm of back    takes Flexeril as needed  . Weakness    numbness and tingling both feet    Past Surgical History:  Procedure Laterality Date  . BACK SURGERY    . CORONARY STENT INTERVENTION N/A 11/04/2017   Procedure: CORONARY STENT INTERVENTION;  Surgeon: Corky Crafts, MD;  Location: [facility] INVASIVE CV LAB;  Service: Cardiovascular;  Laterality: N/A;  . LEFT HEART CATH AND CORONARY ANGIOGRAPHY N/A 11/04/2017   Procedure: LEFT HEART CATH AND CORONARY ANGIOGRAPHY;  Surgeon: Corky Crafts, MD;  Location: [facility] INVASIVE CV LAB;  Service: Cardiovascular;  Laterality: N/A;  . right 5th finger surgery     in high school    Current Medications: Current Meds  Medication Sig  . albuterol (PROVENTIL HFA;VENTOLIN HFA) 108 (90 Base) MCG/ACT inhaler Inhale 2 puffs into the lungs  every 4 (four) hours as needed for wheezing or shortness of breath.  Marland Kitchen aspirin 81 MG chewable tablet Chew 1 tablet (81 mg total) by mouth daily.  . clopidogrel (PLAVIX) 75 MG tablet Take 1 tablet (75 mg total) by mouth daily. Please make overdue appt with Dr. Katrinka Blazing before anymore refills. 2nd attempt  . EPINEPHrine 0.3 mg/0.3 mL IJ SOAJ injection Inject into the muscle.  . lisinopril (ZESTRIL) 10 MG tablet Take 1 tablet (10 mg total) by mouth daily. Please make overdue appt with Dr. Katrinka Blazing before anymore refills. Thank you 3rd and  Final Attempt  . metoprolol succinate (TOPROL-XL) 100 MG 24 hr tablet Take 1 tablet (100 mg total) by mouth daily. Take with or immediately following a meal.  . Oxycodone HCl 10 MG TABS Take 1 tablet PO every 4-6 hours (#150 for 30 days)  . pantoprazole (PROTONIX) 40 MG tablet TAKE 1 TABLET(40 MG) BY MOUTH DAILY  . pregabalin (LYRICA) 75 MG capsule Take by mouth.  . QUEtiapine (SEROQUEL) 100 MG tablet Take 1 tab at night  . rosuvastatin (CRESTOR) 40 MG tablet Take 1 tablet by mouth daily.  . ticagrelor (BRILINTA) 90 MG TABS tablet Brilinta 90 mg tablet  . tiZANidine (ZANAFLEX) 2 MG tablet TAKE 1 TABLET(2 MG) BY MOUTH TWICE DAILY AS NEEDED  . umeclidinium-vilanterol (ANORO ELLIPTA) 62.5-25 MCG/INH  AEPB Inhale 1 puff into the lungs daily.     Allergies:   Clindamycin, Penicillins, Doxycycline, Morphine and related, Bee venom, Other, and Sulfa antibiotics   Social History   Socioeconomic History  . Marital status: Single    Spouse name: Not on file  . Number of children: Not on file  . Years of education: Not on file  . Highest education level: Not on file  Occupational History    Employer: [employer]  Tobacco Use  . Smoking status: Former Smoker    Packs/day: 0.50    Years: 32.00    Pack years: 16.00    Types: Cigarettes    Quit date: 11/04/2017    Years since quitting: 2.9  . Smokeless tobacco: Never Used  Substance and Sexual Activity   . Alcohol use: No  . Drug use: Not Currently    Types: "Crack" cocaine    Comment: denies anything in past yr  . Sexual activity: Not Currently  Other Topics Concern  . Not on file  Social History Narrative  . Not on file   Social Determinants of Health   Financial Resource Strain: Not on file  Food Insecurity: Not on file  Transportation Needs: Not on file  Physical Activity: Not on file  Stress: Not on file  Social Connections: Not on file     Family History: The patient's family history includes Hypertension in his mother; Kidney failure in his mother.  ROS:   Please see the history of present illness.     All other systems reviewed and are negative.  EKGs/Labs/Other Studies Reviewed:    The following studies were reviewed today: Cath/PCI April 2019  EKG:  EKG is  ordered today.  The ekg ordered today demonstrates NSR, HR 64, NSST changes  Recent Labs: No results found for requested labs within last 8760 hours.  Recent Lipid Panel    Component Value Date/Time   CHOL 91 (L) 12/19/2017 0847   TRIG 65 12/19/2017 0847   HDL 32 (L) 12/19/2017 0847   CHOLHDL 2.8 12/19/2017 0847   CHOLHDL 4.8 11/03/2017 0433   VLDL 17 11/03/2017 0433   LDLCALC 46 12/19/2017 0847    Physical Exam:    VS:  BP 118/78   Pulse 65   Ht 5\' 8"  (1.727 m)   Wt 205 lb (93 kg)   SpO2 97%   BMI 31.17 kg/m     Wt Readings from Last 3 Encounters:  09/29/20 205 lb (93 kg)  12/09/18 213 lb (96.6 kg)  05/22/18 208 lb 6.4 oz (94.5 kg)     GEN:  Well nourished, well developed in no acute distress HEENT: Normal NECK: No JVD; No carotid bruits CARDIAC: RRR, no murmurs, rubs, gallops RESPIRATORY:  Clear to auscultation without rales, wheezing or rhonchi  ABDOMEN: Soft, non-tender, non-distended MUSCULOSKELETAL:  No edema; No deformity  SKIN: Warm and dry NEUROLOGIC:  Alert and oriented x 3 PSYCHIATRIC:  Normal affect   ASSESSMENT:    Pre-op evaluation Patient is at acceptable risk  from a cardiac standpoint for the proposed procedure.  He can hold his Plavix 7 days prior to his spinal injection and his aspirin 3 to 5 days.  I will forward official clearance via epic fax  CAD S/P percutaneous coronary angioplasty OM1 intervention with DES April 2019 in the setting of an NSTEMI.  He had no other significant CAD.  He is done well since from a cardiac standpoint.  Dyslipidemia, goal LDL below  70 Followed by his PCP  Smoker Patient is 1/2 pack a day smoker.  I discussed the importance of smoking cessation with him.  PLAN:    F/U Dr Katrinka Blazing in one year   Medication Adjustments/Labs and Tests Ordered: Current medicines are reviewed at length with the patient today.  Concerns regarding medicines are outlined above.  Orders Placed This Encounter  Procedures  . EKG 12-Lead   No orders of the defined types were placed in this encounter.   Patient Instructions  Medication Instructions:  Continue current medications  *If you need a refill on your cardiac medications before your next appointment, please call your pharmacy*   Lab Work: None ordered   Testing/Procedures: None ordered   Follow-Up: At [facility], you and your health needs are our priority.  As part of our continuing mission to provide you with exceptional heart care, we have created designated Provider Care Teams.  These Care Teams include your primary Cardiologist (physician) and Advanced Practice Providers (APPs -  Physician Assistants and Nurse Practitioners) who all work together to provide you with the care you need, when you need it.  We recommend signing up for the patient portal called "MyChart".  Sign up information is provided on this After Visit Summary.  MyChart is used to connect with patients for Virtual Visits (Telemedicine).  Patients are able to view lab/test results, encounter notes, upcoming appointments, etc.  Non-urgent messages can be sent to your provider as well.   To learn  more about what you can do with MyChart, go to [email or web address].    Your next appointment:   1 year(s)  The format for your next appointment:   In Person  Provider:   You may see Lesleigh Noe, MD or one of the following Advanced Practice Providers on your designated Care Team:    Georgie Chard, NP        Signed, Corine Shelter, [place]  09/29/2020 11:41 AM    [facility]

## 2022-12-01 ENCOUNTER — Ambulatory Visit
Admission: EM | Admit: 2022-12-01 | Discharge: 2022-12-01 | Disposition: A | Discharge status: Home / Self Care | Payer: Worker's Compensation | Attending: Family Medicine | Admitting: Family Medicine

## 2022-12-01 DIAGNOSIS — M545 Low back pain, unspecified: Secondary | ICD-10-CM

## 2022-12-01 NOTE — ED Triage Notes (Signed)
Pt states last night at work he was picking up a empty pallet and felt a twinge/pain in his lower back and now having pain and tightness.

## 2022-12-01 NOTE — Discharge Instructions (Signed)
Increase your tizanidine to 4 mg twice daily until follow up.

## 2022-12-03 NOTE — ED Provider Notes (Signed)
[facility]ENTER   [id number] 12/01/22 Arrival Time: 1409  ASSESSMENT & PLAN:  1. Acute left-sided low back pain without sciatica    Able to ambulate here and hemodynamically stable. No indication for imaging of back at this time given no trauma and normal neurological exam. Discussed.  Has a muscle relaxer and oxycodone at home. Recommend [facility] f/u tomorrow with worker's comp paperwork. Work note provided.  Recommend:  Follow-up Information     Schedule an appointment as soon as possible for a visit  with [facility].   Why: [address] [place], [place] [address]  [phone]                Reviewed expectations re: course of current medical issues. Questions answered. Outlined signs and symptoms indicating need for more acute intervention. Patient verbalized understanding. After Visit Summary given.   SUBJECTIVE: History from: patient.  Jose Schultz is a 57 y.o. male who presents with complaint of  picking up a empty pallet at work last evening and felt a twinge/pain in his lower back and now having pain and tightness. Continuing pain today. Normal bowel/bladder habits. Normal ambulation. No extremity sensation changes or weakness.  No tx PTA.  OBJECTIVE:  Vitals:   12/01/22 1443  BP: (!) 149/98  Pulse: 96  Resp: 18  Temp: 98 F (36.7 C)  TempSrc: Oral  SpO2: 97%    General appearance: alert; no distress HEENT: [place]; AT Neck: supple with FROM; without midline tenderness CV: regular Lungs: unlabored respirations; speaks full sentences without difficulty Abdomen: soft, non-tender; non-distended Back: moderate and poorly localized tenderness to palpation over bilateral lumbar paraspinal musculature ; FROM at waist; bruising: none; without midline tenderness Extremities: without edema; symmetrical without gross deformities; normal ROM of bilateral LE Skin: warm and dry Neurologic: normal gait; normal  sensation and strength of bilateral LE Psychological: alert and cooperative; normal mood and affect  Allergies  Allergen Reactions   Clindamycin Other (See Comments)    Other reaction(s): Abdominal Pain   Penicillins Diarrhea    unknown   Doxycycline     pain   Morphine And Related Other (See Comments)    siezures   Bee Venom Other (See Comments)   Other Other (See Comments)    [place]   Sulfa Antibiotics Rash    Past Medical History:  Diagnosis Date   Chronic back pain    spondylolisthesis and radiculopathy   Depression    hx of-per pt no meds now   Hypertension    Muscle spasm of back    takes Flexeril as needed   Weakness    numbness and tingling both feet   Social History   Socioeconomic History   Marital status: Single    Spouse name: Not on file   Number of children: Not on file   Years of education: Not on file   Highest education level: Not on file  Occupational History    Employer: [employer]  Tobacco Use   Smoking status: Former    Packs/day: 0.50    Years: 32.00    Additional pack years: 0.00    Total pack years: 16.00    Types: Cigarettes    Quit date: 11/04/2017    Years since quitting: 5.0   Smokeless tobacco: Never  Substance and Sexual Activity   Alcohol use: No   Drug use: Not Currently    Types: "Crack" cocaine    Comment: denies anything in  past yr   Sexual activity: Not Currently  Other Topics Concern   Not on file  Social History Narrative   Not on file   Social Determinants of Health   Financial Resource Strain: Not on file  Food Insecurity: Not on file  Transportation Needs: Not on file  Physical Activity: Not on file  Stress: Not on file  Social Connections: Not on file  Intimate Partner Violence: Not on file   Family History  Problem Relation Age of Onset   Hypertension Mother    Kidney failure Mother    Past Surgical History:  Procedure Laterality Date   BACK SURGERY     CORONARY STENT  INTERVENTION N/A 11/04/2017   Procedure: CORONARY STENT INTERVENTION;  Surgeon: Corky Crafts, MD;  Location: [facility] INVASIVE CV LAB;  Service: Cardiovascular;  Laterality: N/A;   LEFT HEART CATH AND CORONARY ANGIOGRAPHY N/A 11/04/2017   Procedure: LEFT HEART CATH AND CORONARY ANGIOGRAPHY;  Surgeon: Corky Crafts, MD;  Location: [facility] INVASIVE CV LAB;  Service: Cardiovascular;  Laterality: N/A;   right 5th finger surgery     in high school      Mardella Layman, MD 12/03/22 0900

## 2022-12-04 ENCOUNTER — Other Ambulatory Visit: Payer: Self-pay

## 2022-12-04 ENCOUNTER — Encounter (HOSPITAL_COMMUNITY): Payer: Self-pay

## 2022-12-04 ENCOUNTER — Emergency Department (HOSPITAL_COMMUNITY)
Admission: EM | Admit: 2022-12-04 | Discharge: 2022-12-04 | Disposition: A | Discharge status: Home / Self Care | Payer: 59 | Attending: Emergency Medicine | Admitting: Emergency Medicine

## 2022-12-04 ENCOUNTER — Emergency Department (HOSPITAL_COMMUNITY): Payer: Self-pay

## 2022-12-04 DIAGNOSIS — Z79899 Other long term (current) drug therapy: Secondary | ICD-10-CM | POA: Diagnosis not present

## 2022-12-04 DIAGNOSIS — Z7982 Long term (current) use of aspirin: Secondary | ICD-10-CM | POA: Insufficient documentation

## 2022-12-04 DIAGNOSIS — M545 Low back pain, unspecified: Secondary | ICD-10-CM | POA: Insufficient documentation

## 2022-12-04 DIAGNOSIS — Y990 Civilian activity done for income or pay: Secondary | ICD-10-CM | POA: Diagnosis not present

## 2022-12-04 LAB — URINALYSIS, ROUTINE W REFLEX MICROSCOPIC
Bilirubin Urine: NEGATIVE
Glucose, UA: NEGATIVE mg/dL
Hgb urine dipstick: NEGATIVE
Ketones, ur: NEGATIVE mg/dL
Leukocytes,Ua: NEGATIVE
Nitrite: NEGATIVE
Protein, ur: NEGATIVE mg/dL
Specific Gravity, Urine: 1.021 (ref 1.005–1.030)
pH: 5 (ref 5.0–8.0)

## 2022-12-04 NOTE — ED Triage Notes (Addendum)
Pt states he was putting down an empty pallet at work and he felt a "twinge, like a rubber band popping in lowe back" four days ago. Pt c/o tingling in both feetx4d. Pt states he had urinary incontinence and didn't know he was going. The physician from [facility] sent pt to get MRI of back. Pt was able to walk to triage room.

## 2022-12-04 NOTE — Discharge Instructions (Signed)
Please return to the ED with any new or worsening signs or symptoms such as groin numbness, inability to stand Please follow-up with your neurologist on Friday as we discussed Please continue taking medications as prescribed Please see work note

## 2022-12-04 NOTE — ED Provider Notes (Signed)
[place] EM[facility] DE[facility] AT [facility] Provider Note   CSN: [id number] Arrival date & time: 12/04/22  1704     History Chief Complaint  Patient presents with   Back Pain    Jose Schultz is a 57 y.o. male with medical history of L5/S1 fusion in February 2015 done by [facility], chronic back pain for which he takes oxycodone, muscle relaxers.  Patient presents to ED for evaluation of lower back pain.  The patient reports that on Friday he was at his work when he bent down to pick up a pallet.  Patient reports that he had immediate onset of "twinge, like a rubber band popping in my lower back" when he picked this piled up.  The patient reports he has had significant pain to his lower back for the last 4 days.  Patient states that on Sunday he had an episode of urinary incontinence where he was unaware that he was urinating.  He denies any stool incontinence, lower extremity weakness, groin numbness.  He is endorsing pins-and-needles sensation to his bilateral feet however he states that he has a history of this currently being treated by gabapentin.  Patient was seen in urgent care on 5/4 and discharged.  Patient was seen for Worker's Comp complaint this morning and sent for an MRI of his low back.  The patient denies any fevers, IV drug use.  He has a history of urinary incontinence prior to his L5/S1 fusion.  He is currently being followed by neurology at [facility].  HPI     Home Medications Prior to Admission medications   Medication Sig Start Date End Date Taking? Authorizing Provider  albuterol (PROVENTIL HFA;VENTOLIN HFA) 108 (90 Base) MCG/ACT inhaler Inhale 2 puffs into the lungs every 4 (four) hours as needed for wheezing or shortness of breath. 01/02/17   Hayden Rasmussen, NP  aspirin 81 MG chewable tablet Chew 1 tablet (81 mg total) by mouth daily. 11/05/17   Synetta Fail, MD  clopidogrel (PLAVIX) 75 MG tablet Take 1 tablet (75 mg total) by mouth  daily. Please make overdue appt with Dr. Katrinka Blazing before anymore refills. 2nd attempt 02/02/20   Lyn Records, MD  EPINEPHrine 0.3 mg/0.3 mL IJ SOAJ injection Inject into the muscle. 11/05/19   [provider]  lisinopril (ZESTRIL) 10 MG tablet Take 1 tablet (10 mg total) by mouth daily. Please make overdue appt with Dr. Katrinka Blazing before anymore refills. Thank you 3rd and  Final Attempt 09/05/20   Lyn Records, MD  metoprolol succinate (TOPROL-XL) 100 MG 24 hr tablet Take 1 tablet (100 mg total) by mouth daily. Take with or immediately following a meal. 11/05/17   Synetta Fail, MD  Oxycodone HCl 10 MG TABS Take 1 tablet PO every 4-6 hours (#150 for 30 days) 11/28/18   [provider]  pantoprazole (PROTONIX) 40 MG tablet TAKE 1 TABLET(40 MG) BY MOUTH DAILY 08/01/20   [provider]  pregabalin (LYRICA) 75 MG capsule Take by mouth. 09/23/20 10/23/20  [provider]  QUEtiapine (SEROQUEL) 100 MG tablet Take 1 tab at night 07/26/20   [provider]  rosuvastatin (CRESTOR) 40 MG tablet Take 1 tablet by mouth daily. 11/05/19   [provider]  ticagrelor (BRILINTA) 90 MG TABS tablet Brilinta 90 mg tablet    [provider]  tiZANidine (ZANAFLEX) 2 MG tablet TAKE 1 TABLET(2 MG) BY MOUTH TWICE DAILY AS NEEDED 10/16/19   [provider]  umeclidinium-vilanterol (ANORO ELLIPTA) 62.5-25 MCG/INH AEPB Inhale 1 puff into the lungs daily. 08/01/18   [provider]      Allergies    Clindamycin, Penicillins, Doxycycline, Morphine and related, Bee venom, Other, and Sulfa antibiotics    Review of Systems   Review of Systems  Musculoskeletal:  Positive for back pain.  Neurological:  Negative for numbness.  All other systems reviewed and are negative.   Physical Exam Updated Vital Signs BP (!) 125/93 (BP Location: Right Arm)   Pulse 73   Temp 98 F (36.7 C)   Resp 19   Ht 5\' 8"  (1.727 m)   Wt 93 kg   SpO2 99%   BMI 31.17 kg/m   Physical Exam Vitals and nursing note reviewed.  Constitutional:      General: He is not in acute distress.    Appearance: Normal appearance. He is not ill-appearing, toxic-appearing or diaphoretic.  HENT:     Head: Normocephalic and atraumatic.     Nose: Nose normal.     Mouth/Throat:     Mouth: Mucous membranes are moist.     Pharynx: Oropharynx is clear.  Eyes:     Extraocular Movements: Extraocular movements intact.     Conjunctiva/sclera: Conjunctivae normal.     Pupils: Pupils are equal, round, and reactive to light.  Cardiovascular:     Rate and Rhythm: Normal rate and regular rhythm.  Pulmonary:     Effort: Pulmonary effort is normal.     Breath sounds: Normal breath sounds. No wheezing.  Abdominal:     General: Abdomen is flat. Bowel sounds are normal.     Palpations: Abdomen is soft.     Tenderness: There is no abdominal tenderness.  Musculoskeletal:     Cervical back: Normal range of motion and neck supple. No tenderness.     Comments: Well-healing surgical incision to midline lumbar spine.  Bilateral positive straight leg raise test  Skin:    Capillary Refill: Capillary refill takes less than 2 seconds.  Neurological:     General: No focal deficit present.     Mental Status: He is alert and oriented to person, place, and time.     GCS: GCS eye subscore is 4. GCS verbal subscore is 5. GCS motor subscore is 6.     Cranial Nerves: Cranial nerves 2-12 are intact. No cranial nerve deficit.     Sensory: Sensation is intact. No sensory deficit.     Motor: Motor function is intact. No weakness.     Coordination: Coordination is intact. Heel to [facility] Test normal.     Comments: Intact sensation to bilateral lower extremities.  Cranial nerves II through XII intact.  5 out of 5 strength bilateral lower extremities.     ED Results / Procedures / Treatments   Labs (all labs ordered are listed, but only abnormal results are displayed) Labs Reviewed  URINALYSIS, ROUTINE W  REFLEX MICROSCOPIC    EKG None  Radiology MR LUMBAR SPINE WO CONTRAST  Result Date: 12/04/2022 CLINICAL DATA:  Low back pain EXAM: MRI LUMBAR SPINE WITHOUT CONTRAST TECHNIQUE: Multiplanar, multisequence MR imaging of the lumbar spine was performed. No intravenous contrast was administered. COMPARISON:  04/04/2018 FINDINGS: Segmentation: Prior numbering is maintained. There is lumbarization of S1. Alignment:  Grade 1 anterolisthesis at L5-S1 and S1-S2. Vertebrae: S1-S2 PLIF. No acute abnormality. Modic type 2 endplate signal changes at L5-S1. Conus medullaris and cauda equina: Conus extends to the L2 level. Conus and cauda equina appear  normal. Paraspinal and other soft tissues: Negative Disc levels: L1-L2: Normal disc space and facet joints. No spinal canal stenosis. No neural foraminal stenosis. L2-L3: Normal disc space and facet joints. No spinal canal stenosis. No neural foraminal stenosis. L3-L4: Normal disc space and facet joints. No spinal canal stenosis. No neural foraminal stenosis. L4-L5: Left asymmetric disc bulge with endplate spurring. No spinal canal stenosis. Mild left neural foraminal stenosis. L5-S1: Small disc bulge with endplate spurring. No spinal canal stenosis. Unchanged moderate right neural foraminal stenosis. S1-S2: PLIF.  No spinal canal or neural foraminal stenosis. Visualized sacrum: Normal. IMPRESSION: 1. Unchanged moderate right L5-S1 neural foraminal stenosis. 2. S1-S2 PLIF without spinal canal or neural foraminal stenosis. 3. Mild left L4-5 neural foraminal stenosis, unchanged. Electronically Signed   By: Deatra Robinson M.D.   On: 12/04/2022 20:24    Procedures Procedures   Medications Ordered in ED Medications - No data to display  ED Course/ Medical Decision Making/ A&P  Medical Decision Making  57 year old no presents to ED for evaluation.  Please see HPI for further details.  On examination the patient is afebrile and nontachycardic.  His lung sounds are clear  bilaterally, he is not hypoxic.  His abdomen is soft and compressible throughout.  Neurological examination shows no focal neurodeficits.  He has intact sensation to his bilateral lower extremities.  5 out of 5 strength bilateral lower extremities.  Bilateral positive straight leg raise test.  Well-healing surgical incision to midline lumbar spine.  Patient overall nontoxic appearance.  Patient postvoid residual 0.  Patient urinalysis unremarkable.  Patient shows steady gait and ambulates around the department without issue.  The patient MR study does not show any evidence of acute changes compared to previous studies.  Patient requesting discharge at this time.  The patient will be advised to follow-up with his neurologist.  The patient was advised to continue taking all medications as prescribed.  He was advised that if he has any worsening or new symptoms to return to the ED for evaluation.  He voiced understanding.  He had all of his questions answered his satisfaction.  The patient is stable to discharge home this time.   Final Clinical Impression(s) / ED Diagnoses Final diagnoses:  Low back pain, unspecified back pain laterality, unspecified chronicity, unspecified whether sciatica present    Rx / DC Orders ED Discharge Orders     None         Clent Ridges 12/04/22 2048    Linwood Dibbles, MD 12/05/22 1616

## 2022-12-11 ENCOUNTER — Ambulatory Visit
Admission: EM | Admit: 2022-12-11 | Discharge: 2022-12-11 | Disposition: A | Discharge status: Home / Self Care | Payer: 59 | Attending: Nurse Practitioner | Admitting: Nurse Practitioner

## 2022-12-11 ENCOUNTER — Ambulatory Visit (INDEPENDENT_AMBULATORY_CARE_PROVIDER_SITE_OTHER): Payer: 59

## 2022-12-11 DIAGNOSIS — M25512 Pain in left shoulder: Secondary | ICD-10-CM

## 2022-12-11 MED ORDER — KETOROLAC TROMETHAMINE 30 MG/ML IJ SOLN
30.0000 mg | Freq: Once | INTRAMUSCULAR | Status: AC
  Administered 2022-12-11: 30 mg via INTRAMUSCULAR

## 2022-12-11 NOTE — ED Provider Notes (Signed)
[facility]    CSN: [id number] Arrival date & time: 12/11/22  1859      History   Chief Complaint No chief complaint on file.   HPI Jose Schultz is a 57 y.o. male.   Patient presents today with left shoulder pain that began today after a fall.  Reports he was walking his dog walking up the steps when he tripped over his toe and fell on his left side.  Reports he believes he tried to catch himself with his elbows and he thinks he jammed his shoulder and wonders if it may have dislocated.  He denies numbness or tingling in the fingertips, weakness of the left upper extremity or significant swelling around the shoulder joint.  Reports it hurts to move his shoulder, however he is able to.  Has not taken anything for pain so far.  Reports he takes oxycodone, tizanidine, and gabapentin for chronic pain.    Past Medical History:  Diagnosis Date   Chronic back pain    spondylolisthesis and radiculopathy   Depression    hx of-per pt no meds now   Hypertension    Muscle spasm of back    takes Flexeril as needed   Weakness    numbness and tingling both feet    Patient Active Problem List   Diagnosis Date Noted   Pre-op evaluation 09/29/2020   CAD S/P percutaneous coronary angioplasty 09/29/2020   Dyslipidemia, goal LDL below 70 09/29/2020   Smoker 09/29/2020   NSTEMI (non-ST elevated myocardial infarction) (HCC) 11/03/2017   Chest pain 11/02/2017   Spondylolisthesis of lumbar region 09/02/2013   Depressive disorder, not elsewhere classified 10/08/2012   Back pain 10/06/2012    Past Surgical History:  Procedure Laterality Date   BACK SURGERY     CORONARY STENT INTERVENTION N/A 11/04/2017   Procedure: CORONARY STENT INTERVENTION;  Surgeon: Corky Crafts, MD;  Location: [facility];  Service: Cardiovascular;  Laterality: N/A;   LEFT HEART CATH AND CORONARY ANGIOGRAPHY N/A 11/04/2017   Procedure: LEFT HEART CATH AND CORONARY ANGIOGRAPHY;  Surgeon:  Corky Crafts, MD;  Location: [facility] INVASIVE CV [facility];  Service: Cardiovascular;  Laterality: N/A;   right 5th finger surgery     in high school       Home Medications    Prior to Admission medications   Medication Sig Start Date End Date Taking? Authorizing Provider  albuterol (PROVENTIL HFA;VENTOLIN HFA) 108 (90 Base) MCG/ACT inhaler Inhale 2 puffs into the lungs every 4 (four) hours as needed for wheezing or shortness of breath. 01/02/17   Hayden Rasmussen, NP  aspirin 81 MG chewable tablet Chew 1 tablet (81 mg total) by mouth daily. 11/05/17   Synetta Fail, MD  clopidogrel (PLAVIX) 75 MG tablet Take 1 tablet (75 mg total) by mouth daily. Please make overdue appt with Dr. Katrinka Blazing before anymore refills. 2nd attempt 02/02/20   Lyn Records, MD  EPINEPHrine 0.3 mg/0.3 mL IJ SOAJ injection Inject into the muscle. 11/05/19   [provider]  lisinopril (ZESTRIL) 10 MG tablet Take 1 tablet (10 mg total) by mouth daily. Please make overdue appt with Dr. Katrinka Blazing before anymore refills. Thank you 3rd and  Final Attempt 09/05/20   Lyn Records, MD  metoprolol succinate (TOPROL-XL) 100 MG 24 hr tablet Take 1 tablet (100 mg total) by mouth daily. Take with or immediately following a meal. 11/05/17   Synetta Fail, MD  Oxycodone HCl 10 MG TABS  Take 1 tablet PO every 4-6 hours (#150 for 30 days) 11/28/18   [provider]  pantoprazole (PROTONIX) 40 MG tablet TAKE 1 TABLET(40 MG) BY MOUTH DAILY 08/01/20   [provider]  pregabalin (LYRICA) 75 MG capsule Take by mouth. 09/23/20 10/23/20  [provider]  QUEtiapine (SEROQUEL) 100 MG tablet Take 1 tab at night 07/26/20   [provider]  rosuvastatin (CRESTOR) 40 MG tablet Take 1 tablet by mouth daily. 11/05/19   [provider]  ticagrelor (BRILINTA) 90 MG TABS tablet Brilinta 90 mg tablet    [provider]  tiZANidine (ZANAFLEX) 2 MG tablet TAKE 1 TABLET(2 MG) BY MOUTH TWICE DAILY AS NEEDED  10/16/19   [provider]  umeclidinium-vilanterol (ANORO ELLIPTA) 62.5-25 MCG/INH AEPB Inhale 1 puff into the lungs daily. 08/01/18   [provider]    Family History Family History  Problem Relation Age of Onset   Hypertension Mother    Kidney failure Mother     Social History Social History   Tobacco Use   Smoking status: Every Day    Packs/day: 0.50    Years: 32.00    Additional pack years: 0.00    Total pack years: 16.00    Types: Cigarettes    Last attempt to quit: 11/04/2017    Years since quitting: 5.1   Smokeless tobacco: Never  Substance Use Topics   Alcohol use: No   Drug use: Not Currently    Types: "Crack" cocaine    Comment: denies anything in past yr     Allergies   Clindamycin, Penicillins, Doxycycline, Morphine and related, Bee venom, Other, and Sulfa antibiotics   Review of Systems Review of Systems Per HPI  Physical Exam Triage Vital Signs ED Triage Vitals  Enc Vitals Group     BP 12/11/22 1918 130/76     Pulse Rate 12/11/22 1918 93     Resp 12/11/22 1918 15     Temp 12/11/22 1918 98.5 F (36.9 C)     Temp Source 12/11/22 1918 Oral     SpO2 12/11/22 1918 98 %     Weight --      Height --      Head Circumference --      Peak Flow --      Pain Score 12/11/22 1922 6     Pain Loc --      Pain Edu? --      Excl. in GC? --    No data found.  Updated Vital Signs BP 130/76 (BP Location: Right Arm)   Pulse 93   Temp 98.5 F (36.9 C) (Oral)   Resp 15   SpO2 98%   Visual Acuity Right Eye Distance:   Left Eye Distance:   Bilateral Distance:    Right Eye Near:   Left Eye Near:    Bilateral Near:     Physical Exam Vitals and nursing note reviewed.  Constitutional:      General: He is not in acute distress.    Appearance: Normal appearance. He is not toxic-appearing.  Pulmonary:     Effort: Pulmonary effort is normal. No respiratory distress.  Musculoskeletal:     Right shoulder: No swelling, deformity,  effusion, tenderness or bony tenderness. Normal range of motion. Normal strength. Normal pulse.     Left shoulder: Normal.       Arms:     Comments: Inspection: No swelling, obvious deformity, redness, or bruising to left upper extremity  Palpation:  Left shoulder tender to palpation in approximately area marked; no obvious deformities palpated ROM: Full passive ROM of left shoulder, painful, however full.  Patient has full range of motion of left elbow and left wrist, all 5 digits. Strength: 5/5 left upper extremity Neurovascular: neurovascularly intact in left and right upper extremity   Skin:    General: Skin is warm and dry.     Capillary Refill: Capillary refill takes less than 2 seconds.     Coloration: Skin is not jaundiced or pale.     Findings: No erythema.  Neurological:     Mental Status: He is alert and oriented to person, place, and time.  Psychiatric:        Behavior: Behavior is cooperative.      UC Treatments / Results  Labs (all labs ordered are listed, but only abnormal results are displayed) Labs Reviewed - No data to display  EKG   Radiology DG Shoulder Left  Result Date: 12/11/2022 CLINICAL DATA:  Fall, pain. EXAM: LEFT SHOULDER - 2+ VIEW COMPARISON:  Chest x-ray 11/02/2017 FINDINGS: Examination is limited secondary to patient positioning. There is no definite acute fracture or dislocation. Deformity at the level of the glenoid and scapular wing may be related to patient positioning or old healed fracture. The soft tissues are within normal limits. IMPRESSION: No definite acute fracture or dislocation. Deformity at the level of the glenoid and scapular wing may be related to patient positioning or old healed fracture. Please correlate for point tenderness. If there is high clinical concern for acute fracture, consider CT. Electronically Signed   By: Darliss Cheney M.D.   On: 12/11/2022 19:48    Procedures Procedures (including critical care time)  Medications  Ordered in UC Medications  ketorolac (TORADOL) 30 MG/ML injection 30 mg (has no administration in time range)    Initial Impression / Assessment and Plan / UC Course  I have reviewed the triage vital signs and the nursing notes.  Pertinent labs & imaging results that were available during my care of the patient were reviewed by me and considered in my medical decision making (see chart for details).   Patient is well-appearing, normotensive, afebrile, not tachycardic, not tachypneic, oxygenating well on room air.    1. Acute pain of left shoulder Shoulder x-ray today is negative for fracture in anterior shoulder Patient has range of motion of left shoulder Supportive care discussed with patient Pain treated with Toradol 30 mg IM today in urgent care Recommended ice and rest, follow-up with orthopedic provider with no improvement or worsening of symptoms despite treatment  The patient was given the opportunity to ask questions.  All questions answered to their satisfaction.  The patient is in agreement to this plan.    Final Clinical Impressions(s) / UC Diagnoses   Final diagnoses:  Acute pain of left shoulder     Discharge Instructions      The shoulder x-ray today does not show any broken bones.  We have given you an anti-inflammatory shot today to help with pain.  You can apply ice to the area 15 minutes on, 45 minutes off every hour while awake to help with pain and swelling.  Follow-up with orthopedic provider with no improvement or worsening of symptoms despite treatment.    ED Prescriptions   None    PDMP not reviewed this encounter.   Valentino Nose, NP 12/11/22 2001

## 2022-12-11 NOTE — Discharge Instructions (Signed)
The shoulder x-ray today does not show any broken bones.  We have given you an anti-inflammatory shot today to help with pain.  You can apply ice to the area 15 minutes on, 45 minutes off every hour while awake to help with pain and swelling.  Follow-up with orthopedic provider with no improvement or worsening of symptoms despite treatment.

## 2022-12-11 NOTE — ED Triage Notes (Signed)
Pt c/o shoulder pain after falling up the steps when coming back in from walking dog. Left foot got caught on the step and he landed on left side causing shoulder

## 2022-12-26 ENCOUNTER — Emergency Department (HOSPITAL_COMMUNITY)
Admission: EM | Admit: 2022-12-26 | Discharge: 2022-12-27 | Disposition: A | Discharge status: Home / Self Care | Payer: 59 | Attending: Emergency Medicine | Admitting: Emergency Medicine

## 2022-12-26 ENCOUNTER — Other Ambulatory Visit: Payer: Self-pay

## 2022-12-26 ENCOUNTER — Encounter (HOSPITAL_COMMUNITY): Payer: Self-pay | Admitting: Emergency Medicine

## 2022-12-26 ENCOUNTER — Emergency Department (HOSPITAL_COMMUNITY): Payer: 59

## 2022-12-26 DIAGNOSIS — Z7982 Long term (current) use of aspirin: Secondary | ICD-10-CM | POA: Insufficient documentation

## 2022-12-26 DIAGNOSIS — M5442 Lumbago with sciatica, left side: Secondary | ICD-10-CM | POA: Insufficient documentation

## 2022-12-26 DIAGNOSIS — I10 Essential (primary) hypertension: Secondary | ICD-10-CM | POA: Insufficient documentation

## 2022-12-26 DIAGNOSIS — Z7902 Long term (current) use of antithrombotics/antiplatelets: Secondary | ICD-10-CM | POA: Diagnosis not present

## 2022-12-26 DIAGNOSIS — G8929 Other chronic pain: Secondary | ICD-10-CM | POA: Insufficient documentation

## 2022-12-26 DIAGNOSIS — Z79899 Other long term (current) drug therapy: Secondary | ICD-10-CM | POA: Diagnosis not present

## 2022-12-26 DIAGNOSIS — M5441 Lumbago with sciatica, right side: Secondary | ICD-10-CM | POA: Insufficient documentation

## 2022-12-26 LAB — CBC
HCT: 47.4 % (ref 39.0–52.0)
Hemoglobin: 15.9 g/dL (ref 13.0–17.0)
MCH: 30.1 pg (ref 26.0–34.0)
MCHC: 33.5 g/dL (ref 30.0–36.0)
MCV: 89.6 fL (ref 80.0–100.0)
Platelets: 180 10*3/uL (ref 150–400)
RBC: 5.29 MIL/uL (ref 4.22–5.81)
RDW: 12.7 % (ref 11.5–15.5)
WBC: 5.7 10*3/uL (ref 4.0–10.5)
nRBC: 0 % (ref 0.0–0.2)

## 2022-12-26 LAB — TROPONIN I (HIGH SENSITIVITY)
Troponin I (High Sensitivity): 2 ng/L (ref <18)
Troponin I (High Sensitivity): 3 ng/L (ref <18)

## 2022-12-26 LAB — BASIC METABOLIC PANEL
Anion gap: 9 (ref 5–15)
BUN: 9 mg/dL (ref 6–20)
CO2: 26 mmol/L (ref 22–32)
Calcium: 9.2 mg/dL (ref 8.9–10.3)
Chloride: 104 mmol/L (ref 98–111)
Creatinine, Ser: 1.2 mg/dL (ref 0.61–1.24)
GFR, Estimated: >60 mL/min (ref >60)
Glucose, Bld: 105 mg/dL — ABNORMAL HIGH (ref 70–99)
Potassium: 3.5 mmol/L (ref 3.5–5.1)
Sodium: 139 mmol/L (ref 135–145)

## 2022-12-26 NOTE — ED Provider Notes (Signed)
[name] Provider Note   CSN: [id number] Arrival date & time: 12/26/22  1644     History  Chief Complaint  Patient presents with   Hypertension    Jose Schultz is a 57 y.o. male.  Presents to the emergency department for evaluation of elevated blood pressure.  Patient does have a history of chronic hypertension.  He has been taking all of his normal meds.  Patient was referred to the ER today when his blood pressure was noted to be 186/120 while he was participating in physical therapy.  He did not experience any chest pain or shortness of breath at that time.  Patient reports that he was in pain and thinks that is why his blood pressure was elevated.       Home Medications Prior to Admission medications   Medication Sig Start Date End Date Taking? Authorizing Provider  albuterol (PROVENTIL HFA;VENTOLIN HFA) 108 (90 Base) MCG/ACT inhaler Inhale 2 puffs into the lungs every 4 (four) hours as needed for wheezing or shortness of breath. 01/02/17   Hayden Rasmussen, NP  aspirin 81 MG chewable tablet Chew 1 tablet (81 mg total) by mouth daily. 11/05/17   Synetta Fail, MD  clopidogrel (PLAVIX) 75 MG tablet Take 1 tablet (75 mg total) by mouth daily. Please make overdue appt with Dr. Katrinka Blazing before anymore refills. 2nd attempt 02/02/20   Lyn Records, MD  EPINEPHrine 0.3 mg/0.3 mL IJ SOAJ injection Inject into the muscle. 11/05/19   [provider]  lisinopril (ZESTRIL) 10 MG tablet Take 1 tablet (10 mg total) by mouth daily. Please make overdue appt with Dr. Katrinka Blazing before anymore refills. Thank you 3rd and  Final Attempt 09/05/20   Lyn Records, MD  metoprolol succinate (TOPROL-XL) 100 MG 24 hr tablet Take 1 tablet (100 mg total) by mouth daily. Take with or immediately following a meal. 11/05/17   Synetta Fail, MD  Oxycodone HCl 10 MG TABS Take 1 tablet PO every 4-6 hours (#150 for 30 days) 11/28/18   [provider]   pantoprazole (PROTONIX) 40 MG tablet TAKE 1 TABLET(40 MG) BY MOUTH DAILY 08/01/20   [provider]  pregabalin (LYRICA) 75 MG capsule Take by mouth. 09/23/20 10/23/20  [provider]  QUEtiapine (SEROQUEL) 100 MG tablet Take 1 tab at night 07/26/20   [provider]  rosuvastatin (CRESTOR) 40 MG tablet Take 1 tablet by mouth daily. 11/05/19   [provider]  ticagrelor (BRILINTA) 90 MG TABS tablet Brilinta 90 mg tablet    [provider]  tiZANidine (ZANAFLEX) 2 MG tablet TAKE 1 TABLET(2 MG) BY MOUTH TWICE DAILY AS NEEDED 10/16/19   [provider]  umeclidinium-vilanterol (ANORO ELLIPTA) 62.5-25 MCG/INH AEPB Inhale 1 puff into the lungs daily. 08/01/18   [provider]      Allergies    Clindamycin, Penicillins, Doxycycline, Morphine and codeine, Bee venom, Other, and Sulfa antibiotics    Review of Systems   Review of Systems  Physical Exam Updated Vital Signs BP (!) 132/99 (BP Location: Left Arm)   Pulse 78   Temp 98.2 F (36.8 C)   Resp 16   Ht 5\' 8"  (1.727 m)   Wt 81.6 kg   SpO2 100%   BMI 27.37 kg/m  Physical Exam Vitals and nursing note reviewed.  Constitutional:      General: He is not in acute distress.    Appearance: He is well-developed.  HENT:     Head: Normocephalic and atraumatic.     Mouth/Throat:     Mouth: Mucous membranes are moist.  Eyes:     General: Vision grossly intact. Gaze aligned appropriately.     Extraocular Movements: Extraocular movements intact.     Conjunctiva/sclera: Conjunctivae normal.  Cardiovascular:     Rate and Rhythm: Normal rate and regular rhythm.     Pulses: Normal pulses.     Heart sounds: Normal heart sounds, S1 normal and S2 normal. No murmur heard.    No friction rub. No gallop.  Pulmonary:     Effort: Pulmonary effort is normal. No respiratory distress.     Breath sounds: Normal breath sounds.  Abdominal:     Palpations: Abdomen is soft.     Tenderness: There is  no abdominal tenderness. There is no guarding or rebound.     Hernia: No hernia is present.  Musculoskeletal:        General: No swelling.     Cervical back: Full passive range of motion without pain, normal range of motion and neck supple. No pain with movement, spinous process tenderness or muscular tenderness. Normal range of motion.     Right lower leg: No edema.     Left lower leg: No edema.  Skin:    General: Skin is warm and dry.     Capillary Refill: Capillary refill takes less than 2 seconds.     Findings: No ecchymosis, erythema, lesion or wound.  Neurological:     Mental Status: He is alert and oriented to person, place, and time.     GCS: GCS eye subscore is 4. GCS verbal subscore is 5. GCS motor subscore is 6.     Cranial Nerves: Cranial nerves 2-12 are intact.     Sensory: Sensation is intact.     Motor: Motor function is intact. No weakness or abnormal muscle tone.     Coordination: Coordination is intact.  Psychiatric:        Mood and Affect: Mood normal.        Speech: Speech normal.        Behavior: Behavior normal.     ED Results / Procedures / Treatments   Labs (all labs ordered are listed, but only abnormal results are displayed) Labs Reviewed  BASIC METABOLIC PANEL - Abnormal; Notable for the following components:      Result Value   Glucose, Bld 105 (*)    All other components within normal limits  CBC  TROPONIN I (HIGH SENSITIVITY)  TROPONIN I (HIGH SENSITIVITY)    EKG EKG Interpretation  Date/Time:  Wednesday Dec 26 2022 22:42:23 EDT Ventricular Rate:  81 PR Interval:  146 QRS Duration: 80 QT Interval:  364 QTC Calculation: 422 R Axis:   36 Text Interpretation: Normal sinus rhythm Normal ECG When compared with ECG of 05-Nov-2017 02:36, No significant change was found Confirmed by Gilda Crease [phone]) on 12/26/2022 11:37:12 PM  Radiology DG Chest 2 View  Result Date: 12/26/2022 CLINICAL DATA:  Chest pain. EXAM: CHEST - 2 VIEW  COMPARISON:  November 02, 2017. FINDINGS: The heart size and mediastinal contours are within normal limits. Both lungs are clear. The visualized skeletal structures are unremarkable. IMPRESSION: No active cardiopulmonary disease. Electronically Signed   By: Lupita Raider M.D.   On: 12/26/2022 18:25    Procedures Procedures    Medications Ordered in ED Medications - No data to display  ED Course/ Medical Decision Making/ A&P  Medical Decision Making Amount and/or Complexity of Data Reviewed Labs: ordered. Radiology: ordered.   Patient appears well.  He is in no distress.  Record review reveals he does have a history of hypertension, he has been taking his Prinivil.  His blood pressure is normal here in the department.  It does seem very reasonable that his elevated blood pressure was a pain response during physical therapy.  Cardiac evaluation is negative today.  He is complaining of numbness of both of his feet but this appears to be a chronic finding according to his records.  He did have an MRI performed recently in the ED and that was reviewed.  No new findings that would suggest cauda equina and no symptoms that would suggest neurosurgical emergency.  He has follow-up with a spinal surgeon pending in the near future.  No new interventions necessary.        Final Clinical Impression(s) / ED Diagnoses Final diagnoses:  Primary hypertension  Chronic bilateral low back pain with bilateral sciatica    Rx / DC Orders ED Discharge Orders     None         Gilda Crease, MD 12/26/22 2353

## 2022-12-26 NOTE — ED Notes (Signed)
ED Provider at bedside. 

## 2022-12-26 NOTE — ED Notes (Signed)
Pt upset over wait times; informed we only have one dr

## 2022-12-26 NOTE — ED Triage Notes (Signed)
Pt sent by PCP for elevated BP 186/120 with new urinary incontinence since May 3rd. Pt had an MRI and bladder scan but was not diagnosed. Has appt with ortho on June 7 for back injury. Pt denies headache, CP, SOB, dizziness.

## 2023-03-20 LAB — COLOGUARD: COLOGUARD: NEGATIVE
